# Patient Record
Sex: Female | Born: 1965 | Race: White | Hispanic: Yes | Marital: Single | Smoking: Never smoker
Health system: Southern US, Community
[De-identification: ages and names within clinical notes are randomized; demographics above are authoritative.]

## PROBLEM LIST (undated history)

## (undated) DIAGNOSIS — H547 Unspecified visual loss: Secondary | ICD-10-CM

## (undated) DIAGNOSIS — F329 Major depressive disorder, single episode, unspecified: Secondary | ICD-10-CM

## (undated) DIAGNOSIS — K219 Gastro-esophageal reflux disease without esophagitis: Secondary | ICD-10-CM

## (undated) DIAGNOSIS — S6990XA Unspecified injury of unspecified wrist, hand and finger(s), initial encounter: Secondary | ICD-10-CM

## (undated) DIAGNOSIS — K432 Incisional hernia without obstruction or gangrene: Secondary | ICD-10-CM

## (undated) DIAGNOSIS — Z9884 Bariatric surgery status: Secondary | ICD-10-CM

## (undated) DIAGNOSIS — F32A Depression, unspecified: Secondary | ICD-10-CM

## (undated) DIAGNOSIS — F419 Anxiety disorder, unspecified: Secondary | ICD-10-CM

## (undated) HISTORY — PX: HERNIA REPAIR: SHX51

## (undated) HISTORY — PX: GASTRIC BYPASS: SHX52

## (undated) HISTORY — PX: CHOLECYSTECTOMY: SHX55

---

## 2016-05-11 ENCOUNTER — Emergency Department (HOSPITAL_COMMUNITY): Payer: Medicare Other

## 2016-05-11 ENCOUNTER — Encounter (HOSPITAL_COMMUNITY): Payer: Self-pay | Admitting: Emergency Medicine

## 2016-05-11 ENCOUNTER — Observation Stay (HOSPITAL_COMMUNITY)
Admission: EM | Admit: 2016-05-11 | Discharge: 2016-05-12 | Disposition: A | Discharge status: Home / Self Care | Payer: Medicare Other | Attending: Student in an Organized Health Care Education/Training Program | Admitting: Student in an Organized Health Care Education/Training Program

## 2016-05-11 DIAGNOSIS — F329 Major depressive disorder, single episode, unspecified: Secondary | ICD-10-CM | POA: Diagnosis not present

## 2016-05-11 DIAGNOSIS — K219 Gastro-esophageal reflux disease without esophagitis: Secondary | ICD-10-CM | POA: Insufficient documentation

## 2016-05-11 DIAGNOSIS — Z9049 Acquired absence of other specified parts of digestive tract: Secondary | ICD-10-CM | POA: Diagnosis not present

## 2016-05-11 DIAGNOSIS — G8929 Other chronic pain: Secondary | ICD-10-CM | POA: Diagnosis not present

## 2016-05-11 DIAGNOSIS — R42 Dizziness and giddiness: Secondary | ICD-10-CM | POA: Insufficient documentation

## 2016-05-11 DIAGNOSIS — R0789 Other chest pain: Secondary | ICD-10-CM | POA: Diagnosis not present

## 2016-05-11 DIAGNOSIS — Z9884 Bariatric surgery status: Secondary | ICD-10-CM | POA: Insufficient documentation

## 2016-05-11 DIAGNOSIS — D649 Anemia, unspecified: Secondary | ICD-10-CM | POA: Diagnosis not present

## 2016-05-11 DIAGNOSIS — R079 Chest pain, unspecified: Secondary | ICD-10-CM | POA: Diagnosis present

## 2016-05-11 DIAGNOSIS — N390 Urinary tract infection, site not specified: Secondary | ICD-10-CM | POA: Insufficient documentation

## 2016-05-11 DIAGNOSIS — F419 Anxiety disorder, unspecified: Secondary | ICD-10-CM | POA: Insufficient documentation

## 2016-05-11 HISTORY — DX: Incisional hernia without obstruction or gangrene: K43.2

## 2016-05-11 HISTORY — DX: Major depressive disorder, single episode, unspecified: F32.9

## 2016-05-11 HISTORY — DX: Gastro-esophageal reflux disease without esophagitis: K21.9

## 2016-05-11 HISTORY — DX: Depression, unspecified: F32.A

## 2016-05-11 HISTORY — DX: Anxiety disorder, unspecified: F41.9

## 2016-05-11 HISTORY — DX: Unspecified visual loss: H54.7

## 2016-05-11 HISTORY — DX: Bariatric surgery status: Z98.84

## 2016-05-11 LAB — BASIC METABOLIC PANEL
Anion gap: 6 (ref 5–15)
BUN: 14 mg/dL (ref 6–20)
CALCIUM: 8.8 mg/dL — AB (ref 8.9–10.3)
CHLORIDE: 113 mmol/L — AB (ref 101–111)
CO2: 21 mmol/L — ABNORMAL LOW (ref 22–32)
CREATININE: 0.88 mg/dL (ref 0.44–1.00)
Glucose, Bld: 68 mg/dL (ref 65–99)
Potassium: 3.9 mmol/L (ref 3.5–5.1)
SODIUM: 140 mmol/L (ref 135–145)

## 2016-05-11 LAB — HEPATIC FUNCTION PANEL
ALBUMIN: 3 g/dL — AB (ref 3.5–5.0)
ALK PHOS: 138 U/L — AB (ref 38–126)
ALT: 34 U/L (ref 14–54)
AST: 112 U/L — ABNORMAL HIGH (ref 15–41)
BILIRUBIN INDIRECT: 0.2 mg/dL — AB (ref 0.3–0.9)
Bilirubin, Direct: 0.2 mg/dL (ref 0.1–0.5)
TOTAL PROTEIN: 6.2 g/dL — AB (ref 6.5–8.1)
Total Bilirubin: 0.4 mg/dL (ref 0.3–1.2)

## 2016-05-11 LAB — FERRITIN: FERRITIN: 36 ng/mL (ref 11–307)

## 2016-05-11 LAB — URINALYSIS, ROUTINE W REFLEX MICROSCOPIC
BILIRUBIN URINE: NEGATIVE
Glucose, UA: NEGATIVE mg/dL
HGB URINE DIPSTICK: NEGATIVE
KETONES UR: NEGATIVE mg/dL
NITRITE: POSITIVE — AB
Protein, ur: NEGATIVE mg/dL
SPECIFIC GRAVITY, URINE: 1.013 (ref 1.005–1.030)
pH: 6 (ref 5.0–8.0)

## 2016-05-11 LAB — URINE MICROSCOPIC-ADD ON

## 2016-05-11 LAB — TROPONIN I

## 2016-05-11 LAB — CBC WITH DIFFERENTIAL/PLATELET
BASOS ABS: 0 10*3/uL (ref 0.0–0.1)
BASOS PCT: 0 %
EOS ABS: 0.1 10*3/uL (ref 0.0–0.7)
EOS PCT: 1 %
HCT: 32.4 % — ABNORMAL LOW (ref 36.0–46.0)
HEMOGLOBIN: 9.8 g/dL — AB (ref 12.0–15.0)
LYMPHS ABS: 1.1 10*3/uL (ref 0.7–4.0)
Lymphocytes Relative: 14 %
MCH: 27.2 pg (ref 26.0–34.0)
MCHC: 30.2 g/dL (ref 30.0–36.0)
MCV: 90 fL (ref 78.0–100.0)
Monocytes Absolute: 0.4 10*3/uL (ref 0.1–1.0)
Monocytes Relative: 5 %
NEUTROS PCT: 80 %
Neutro Abs: 6.6 10*3/uL (ref 1.7–7.7)
PLATELETS: 207 10*3/uL (ref 150–400)
RBC: 3.6 MIL/uL — AB (ref 3.87–5.11)
RDW: 16.2 % — ABNORMAL HIGH (ref 11.5–15.5)
WBC: 8.2 10*3/uL (ref 4.0–10.5)

## 2016-05-11 LAB — CBC
HCT: 34.7 % — ABNORMAL LOW (ref 36.0–46.0)
Hemoglobin: 10.4 g/dL — ABNORMAL LOW (ref 12.0–15.0)
MCH: 26.8 pg (ref 26.0–34.0)
MCHC: 30 g/dL (ref 30.0–36.0)
MCV: 89.4 fL (ref 78.0–100.0)
PLATELETS: 225 10*3/uL (ref 150–400)
RBC: 3.88 MIL/uL (ref 3.87–5.11)
RDW: 16.3 % — ABNORMAL HIGH (ref 11.5–15.5)
WBC: 8.8 10*3/uL (ref 4.0–10.5)

## 2016-05-11 LAB — POC OCCULT BLOOD, ED: FECAL OCCULT BLD: NEGATIVE

## 2016-05-11 LAB — I-STAT TROPONIN, ED: TROPONIN I, POC: 0 ng/mL (ref 0.00–0.08)

## 2016-05-11 LAB — TSH: TSH: 0.936 u[IU]/mL (ref 0.350–4.500)

## 2016-05-11 LAB — LIPASE, BLOOD: LIPASE: 12 U/L (ref 11–51)

## 2016-05-11 LAB — D-DIMER, QUANTITATIVE (NOT AT ARMC): D DIMER QUANT: 0.38 ug{FEU}/mL (ref 0.00–0.50)

## 2016-05-11 LAB — GAMMA GT: GGT: 131 U/L — AB (ref 7–50)

## 2016-05-11 MED ORDER — MORPHINE SULFATE (PF) 2 MG/ML IV SOLN
2.0000 mg | Freq: Once | INTRAVENOUS | Status: AC
  Administered 2016-05-11: 2 mg via INTRAVENOUS
  Filled 2016-05-11: qty 1

## 2016-05-11 MED ORDER — ALBUTEROL SULFATE (2.5 MG/3ML) 0.083% IN NEBU: 3.0000 mL | INHALATION_SOLUTION | Freq: Two times a day (BID) | RESPIRATORY_TRACT | Status: DC

## 2016-05-11 MED ORDER — GI COCKTAIL ~~LOC~~
30.0000 mL | Freq: Once | ORAL | Status: AC
  Administered 2016-05-11: 30 mL via ORAL
  Filled 2016-05-11: qty 30

## 2016-05-11 MED ORDER — MECLIZINE HCL 25 MG PO TABS
25.0000 mg | ORAL_TABLET | Freq: Once | ORAL | Status: AC
  Administered 2016-05-11: 25 mg via ORAL
  Filled 2016-05-11: qty 1

## 2016-05-11 MED ORDER — GI COCKTAIL ~~LOC~~
30.0000 mL | Freq: Two times a day (BID) | ORAL | Status: DC | PRN
  Filled 2016-05-11: qty 30

## 2016-05-11 MED ORDER — ALPRAZOLAM 0.5 MG PO TABS
1.0000 mg | ORAL_TABLET | Freq: Three times a day (TID) | ORAL | Status: DC
  Administered 2016-05-11 – 2016-05-12 (×4): 1 mg via ORAL
  Filled 2016-05-11 (×4): qty 2

## 2016-05-11 MED ORDER — GABAPENTIN 300 MG PO CAPS
300.0000 mg | ORAL_CAPSULE | Freq: Three times a day (TID) | ORAL | Status: DC
  Administered 2016-05-11 – 2016-05-12 (×3): 300 mg via ORAL
  Filled 2016-05-11 (×3): qty 1

## 2016-05-11 MED ORDER — FAMOTIDINE IN NACL 20-0.9 MG/50ML-% IV SOLN
20.0000 mg | Freq: Once | INTRAVENOUS | Status: AC
  Administered 2016-05-11: 20 mg via INTRAVENOUS
  Filled 2016-05-11: qty 50

## 2016-05-11 MED ORDER — ONDANSETRON HCL 4 MG/2ML IJ SOLN
4.0000 mg | Freq: Once | INTRAMUSCULAR | Status: AC
  Administered 2016-05-11: 4 mg via INTRAVENOUS
  Filled 2016-05-11: qty 2

## 2016-05-11 MED ORDER — DEXTROSE 5 % IV SOLN
1.0000 g | INTRAVENOUS | Status: DC
  Administered 2016-05-11: 1 g via INTRAVENOUS
  Filled 2016-05-11 (×2): qty 10

## 2016-05-11 MED ORDER — FAMOTIDINE 20 MG PO TABS
40.0000 mg | ORAL_TABLET | Freq: Every day | ORAL | Status: DC
  Administered 2016-05-11: 40 mg via ORAL
  Filled 2016-05-11 (×2): qty 2

## 2016-05-11 MED ORDER — ALBUTEROL SULFATE (2.5 MG/3ML) 0.083% IN NEBU: 2.5000 mg | INHALATION_SOLUTION | RESPIRATORY_TRACT | Status: DC | PRN

## 2016-05-11 MED ORDER — METOCLOPRAMIDE HCL 5 MG/5ML PO SOLN
5.0000 mg | Freq: Three times a day (TID) | ORAL | Status: DC | PRN
  Administered 2016-05-12: 5 mg via ORAL
  Filled 2016-05-11 (×2): qty 5

## 2016-05-11 MED ORDER — ESCITALOPRAM OXALATE 10 MG PO TABS
10.0000 mg | ORAL_TABLET | Freq: Every day | ORAL | Status: DC
  Administered 2016-05-12: 10 mg via ORAL
  Filled 2016-05-11: qty 1

## 2016-05-11 MED ORDER — ZOLPIDEM TARTRATE 5 MG PO TABS
5.0000 mg | ORAL_TABLET | Freq: Once | ORAL | Status: AC
  Administered 2016-05-11: 5 mg via ORAL
  Filled 2016-05-11: qty 1

## 2016-05-11 MED ORDER — FENTANYL CITRATE (PF) 100 MCG/2ML IJ SOLN
50.0000 ug | Freq: Once | INTRAMUSCULAR | Status: AC
  Administered 2016-05-11: 50 ug via INTRAVENOUS
  Filled 2016-05-11: qty 2

## 2016-05-11 MED ORDER — PANTOPRAZOLE SODIUM 40 MG PO TBEC
40.0000 mg | DELAYED_RELEASE_TABLET | Freq: Every day | ORAL | Status: DC
  Administered 2016-05-12: 40 mg via ORAL
  Filled 2016-05-11: qty 1

## 2016-05-11 MED ORDER — SODIUM CHLORIDE 0.9 % IV BOLUS (SEPSIS)
1000.0000 mL | Freq: Once | INTRAVENOUS | Status: AC
  Administered 2016-05-11: 1000 mL via INTRAVENOUS

## 2016-05-11 MED ORDER — MORPHINE SULFATE (PF) 2 MG/ML IV SOLN
2.0000 mg | Freq: Once | INTRAVENOUS | Status: AC
Start: 2016-05-11 — End: 2016-05-11
  Administered 2016-05-11: 2 mg via INTRAVENOUS
  Filled 2016-05-11: qty 1

## 2016-05-11 NOTE — ED Provider Notes (Signed)
[facility]-EMERGENCY DEPT Provider Note   CSN: [id number] Arrival date & time: 05/11/16  1335  First Provider Contact:  First MD Initiated Contact with Patient 05/11/16 1400     History   Chief Complaint Chief Complaint  Patient presents with  . Chest Pain   HPI  Tonya Young is an 50 y.o. female with history of aspiration pneumonia, s/p gastric bypass 1999 now with multiple hernias, depression, anxiety, chronic pain, albinism, vertigo who presents to the ED for evaluation of chest pain. She reports she was in her usual state of health until this afternoon after lunch at work she walked up the stairs, sat down at her desk, and within a couple minutes felt nauseated and had a heavy left sided chest pain. She reports the pain was severe. She reports associated shortness of breath but she states she has some shortness of breath at baseline so she is not sure if this SOB was different. She states she has also felt dizzy all day but has some issues with veritgo at baseline and takes meclizine; she states this dizziness feels different. She states she feels lightheaded but not like she is going to pass out. She is also reporting abdominal pain from her hernia but states this is baseline. In the ED now pt feels improvement in her chest pain. She received  ASA and SL nitro with some improvement in her pain. She states her abdomen hurts at baseline. Denies SOB currently. Still feels nauseated and somewhat dizzy. Denies dysuria, urinary frequency/urgency. Denies emesis or diarrhea. Denies fever or chills. She typically gets all of her medical care in [facility].  Past Medical History:  Diagnosis Date  . Blindness     There are no active problems to display for this patient.   Past Surgical History:  Procedure Laterality Date  . CESAREAN SECTION    . CHOLECYSTECTOMY    . GASTRIC BYPASS    . HERNIA REPAIR      OB History    No data available       Home Medications    Prior to Admission  medications   Medication Sig Start Date End Date Taking? Authorizing Provider  ALPRAZolam Prudy Feeler) 1 MG tablet Take 1 tablet by mouth 3 (three) times daily. 04/22/16  Yes Historical Provider, MD  carisoprodol (SOMA) 350 MG tablet Take 1 tablet by mouth 2 (two) times daily. 05/08/16  Yes Historical Provider, MD  escitalopram (LEXAPRO) 10 MG tablet Take 1 tablet by mouth daily. 02/04/16  Yes Historical Provider, MD  Eszopiclone 3 MG TABS Take 1 tablet by mouth at bedtime. 04/22/16  Yes Historical Provider, MD  famotidine (PEPCID) 40 MG tablet Take 1 tablet by mouth daily. 04/19/16  Yes Historical Provider, MD  gabapentin (NEURONTIN) 300 MG capsule Take 1 capsule by mouth 3 (three) times daily. 04/22/16  Yes Historical Provider, MD  metoCLOPramide (REGLAN) 5 MG/5ML solution Take 5 mLs by mouth 3 (three) times daily as needed for nausea. 04/19/16  Yes Historical Provider, MD  omeprazole (PRILOSEC) 40 MG capsule Take 1 capsule by mouth daily. 02/20/16  Yes Historical Provider, MD  VENTOLIN HFA 108 (90 Base) MCG/ACT inhaler Inhale 2 puffs into the lungs 2 (two) times daily. 05/04/16  Yes Historical Provider, MD    Family History No family history on file.  Social History Social History  Substance Use Topics  . Smoking status: Never Smoker  . Smokeless tobacco: Not on file  . Alcohol use Yes     Comment: occasional  Allergies   Latex   Review of Systems Review of Systems 10 Systems reviewed and are negative for acute change except as noted in the HPI.   Physical Exam Updated Vital Signs BP 101/77   Pulse 78   Temp 97.9 F (36.6 C) (Oral)   Resp 25   LMP  (Approximate)   SpO2 97%   Physical Exam  Constitutional: She is oriented to person, place, and time. No distress.  HENT:  Right Ear: External ear normal.  Left Ear: External ear normal.  Nose: Nose normal.  Mouth/Throat: Oropharynx is clear and moist. No oropharyngeal exudate.  Eyes: Conjunctivae are normal.  Neck: Normal range  of motion. Neck supple.  Cardiovascular: Normal rate, regular rhythm, normal heart sounds and intact distal pulses.   Pulmonary/Chest: Effort normal and breath sounds normal. No respiratory distress. She has no wheezes.  Abdominal: Soft. Bowel sounds are normal. She exhibits no distension. There is no rebound and no guarding.  Mild diffuse abdominal tenderness without guarding. Abdomen soft nondistended.  Musculoskeletal: She exhibits no edema.  No LE edema or calf tenderness  Lymphadenopathy:    She has no cervical adenopathy.  Neurological: She is alert and oriented to person, place, and time. No cranial nerve deficit.  Moves all extremities freely  Skin: Skin is warm and dry. She is not diaphoretic.  albinism  Psychiatric: She has a normal mood and affect.  Nursing note and vitals reviewed.    ED Treatments / Results  Labs (all labs ordered are listed, but only abnormal results are displayed) Labs Reviewed  BASIC METABOLIC PANEL - Abnormal; Notable for the following:       Result Value   Chloride 113 (*)    CO2 21 (*)    Calcium 8.8 (*)    All other components within normal limits  CBC WITH DIFFERENTIAL/PLATELET - Abnormal; Notable for the following:    RBC 3.60 (*)    Hemoglobin 9.8 (*)    HCT 32.4 (*)    RDW 16.2 (*)    All other components within normal limits  D-DIMER, QUANTITATIVE (NOT AT [facility])  URINALYSIS, ROUTINE W REFLEX MICROSCOPIC (NOT AT [facility])  HEPATIC FUNCTION PANEL  LIPASE, BLOOD  I-STAT TROPOININ, ED  POC OCCULT BLOOD, ED    EKG  EKG Interpretation  Date/Time:  Friday May 11 2016 13:48:28 EDT Ventricular Rate:  78 PR Interval:    QRS Duration: 92 QT Interval:  378 QTC Calculation: 431 R Axis:   30 Text Interpretation:  Sinus rhythm no acute ST/T changes No old tracing to compare Confirmed by GOLDSTON MD, SCOTT [phone]) on 05/11/2016 3:06:12 PM       Radiology Dg Chest 2 View  Result Date: 05/11/2016 CLINICAL DATA:  Chest pain and dizziness.   Nausea EXAM: CHEST  2 VIEW COMPARISON:  None FINDINGS: Mild cardiac enlargement. No pleural effusion or edema. Diffuse increased interstitial prominence is identified. Asymmetric elevation of the right hemidiaphragm is identified. No focal airspace opacity identified. IMPRESSION: 1. Diffuse interstitial prominence is identified throughout both lungs which may reflect chronic lung disease or superimposed atypical infection. Pulmonary edema may also have a similar appearance. 2. Asymmetric elevation of the right hemidiaphragm. Electronically Signed   By: Signa Kell M.D.   On: 05/11/2016 15:11    Procedures Procedures (including critical care time)  Medications Ordered in ED Medications  sodium chloride 0.9 % bolus 1,000 mL (not administered)  gi cocktail (Maalox,Lidocaine,Donnatal) (30 mLs Oral Given 05/11/16 1439)  ondansetron (ZOFRAN)  injection 4 mg (4 mg Intravenous Given 05/11/16 1439)  fentaNYL (SUBLIMAZE) injection 50 mcg (50 mcg Intravenous Given 05/11/16 1439)  meclizine (ANTIVERT) tablet 25 mg (25 mg Oral Given 05/11/16 1533)  famotidine (PEPCID) IVPB 20 mg premix (20 mg Intravenous New Bag/Given 05/11/16 1533)  morphine 2 MG/ML injection 2 mg (2 mg Intravenous Given 05/11/16 1542)     Initial Impression / Assessment and Plan / ED Course  I have reviewed the triage vital signs and the nursing notes.  Pertinent labs & imaging results that were available during my care of the patient were reviewed by me and considered in my medical decision making (see chart for details).  Clinical Course   EKG nonacute. Troponin negative. D-dimer negative. CXR with possible chronic lung disease vs atypical infection vs pulmonary edema. Pt denies recent URI symptoms. Her CBC does reveal a hemoglobin of 9.8. She reports some chronic anemia but never this low. CBC at [facility] reveals last hgb of 12.5 in early June, and that appears to be her baseline. She denies recent BRBPR, black tarry stools, heavy vaginal  bleeding, easy bruising. Will add heme occult. Her pressures are in the low 100s systolic which she states is baseline for her.   Heme occult negative. Chest pain resolved. Pt continues to feel lightheaded. Nausea is better. She does not have significant cardiac risk factors and I doubt her chest pain is ACS or PE. With her associated lightheadedness/dizziness and nausea I do suspect pt is experiencing some symptomatic anemia with her hemoglobin of 9.8 with baseline 12.5. Suspect pt would benefit from obs admission for serial CBC and further evaluation. Monitor chest pain and serial troponins. I will consult medicine.   I spoke with the IMTS who will admit pt to obs. Appreciate assistance.  Final Clinical Impressions(s) / ED Diagnoses   Final diagnoses:  Symptomatic anemia  Chest pain, unspecified chest pain type  Lightheadedness    New Prescriptions New Prescriptions   No medications on file     Carlene Coria, Cordelia Poche 05/11/16 1636    Pricilla Loveless, MD 05/15/16 [phone]

## 2016-05-11 NOTE — ED Triage Notes (Signed)
Per [facility] patient complaining of chest pain, dizziness, nausea.  Received 324 aspirin, 0.4 SL nitro en route.  Describes pain as squeezing, rates severity as 4 after SL nitro.  Patient states she ate lunch, then walked up three flights of stairs and sat down to start work.  While seated the pain started.  Patient is blind.  20g IV in right wrist saline locked.  Patient alert and oriented and in no apparent distress at this time.

## 2016-05-11 NOTE — Progress Notes (Addendum)
Patient arrived the unit from the ED, vital signs obtained,placed on tele, ccmd notified, patient oriented to room and staff, patient resting quietly in bed, call light within reach, bed in lowest position, will continue to monitor

## 2016-05-11 NOTE — H&P (Signed)
Date: 05/11/2016               Patient Name:  Wayne Brunker MRN: [id number]  DOB: 04/26/1966 Age / Sex: 50 y.o., female   PCP: Pcp Not In System         Medical Service: Internal Medicine Teaching Service         Attending Physician: Dr. Axel Filler, MD    First Contact: Dr. Holley Raring Pager: [phone]  Second Contact: Dr. Charlott Rakes Pager: [phone]       After Hours (After 5p/  First Contact Pager: [phone]  weekends / holidays): Second Contact Pager: [phone]   Chief Complaint: CP and dizziness  History of Present Illness: Ms. Devina Bezold is a 50 y.o. female with a h/o of gastric bypass (3704) complicated by multiple hernias s/p repair, GERD, depression, anxiety, albinism, vertigo who presents with complaints of CP, dizziness, and abdominal pain.  Her symptoms began abruptly after lunch today when she sat down at work and began to feel dizzy. Shortly after she noticed abdominal pain in the epigastric region and atypical CP which lasted for 1 hour and was not relieved by nitro. She reports that she did not experience SOB. She reports that her abdominal pain is consistent with h/o pain from her ventral hernias. She felt better after receiving GI cocktail in the ED.  She denies lower abd pain, denies changes to BM. Endorses some hesitancy with urination, darker coloration, and foul smell. She reports h/o UTI in the past, and describes these symptoms as similar.  She has a h/o anemia tx w/ Fe in the past. Recently underwent closed fracture reduction of left humerus. Also recent CAP on June 9th.  Meds: Current Facility-Administered Medications  Medication Dose Route Frequency Provider Last Rate Last Dose  . albuterol (PROVENTIL) (2.5 MG/3ML) 0.083% nebulizer solution 2.5 mg  2.5 mg Inhalation Q4H PRN Norman Herrlich, MD      . ALPRAZolam Duanne Moron) tablet 1 mg  1 mg Oral TID Norman Herrlich, MD      . cefTRIAXone (ROCEPHIN) 1 g in dextrose 5 % 50 mL IVPB  1 g Intravenous  Q24H Valeda Malm Rumbarger, RPH 100 mL/hr at 05/11/16 1839 1 g at 05/11/16 1839  . [START ON 05/12/2016] escitalopram (LEXAPRO) tablet 10 mg  10 mg Oral Daily Norman Herrlich, MD      . famotidine (PEPCID) tablet 40 mg  40 mg Oral Daily Norman Herrlich, MD      . gabapentin (NEURONTIN) capsule 300 mg  300 mg Oral TID Norman Herrlich, MD      . gi cocktail (Maalox,Lidocaine,Donnatal)  30 mL Oral BID PRN Norman Herrlich, MD      . metoCLOPramide (REGLAN) 5 MG/5ML solution 5 mg  5 mg Oral TID PRN Norman Herrlich, MD      . pantoprazole (PROTONIX) EC tablet 40 mg  40 mg Oral QHS Norman Herrlich, MD       Allergies: Allergies as of 05/11/2016 - Review Complete 05/11/2016  Allergen Reaction Noted  . Latex  05/11/2016   Past Medical History:  Diagnosis Date  . Anxiety and depression   . Blindness   . GERD (gastroesophageal reflux disease)   . Hernia, incisional   . S/P gastric bypass    Family History: No family h/o CAD.  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  Review of Systems: A complete  ROS was negative except as per HPI. Review of Systems  Constitutional: Negative for chills, diaphoresis, fever, malaise/fatigue and weight loss.  Respiratory: Negative for cough, shortness of breath and wheezing.   Cardiovascular: Positive for chest pain. Negative for palpitations and leg swelling.  Gastrointestinal: Positive for abdominal pain and heartburn. Negative for blood in stool, constipation, diarrhea, melena, nausea and vomiting.  Genitourinary: Positive for dysuria.  Neurological: Positive for dizziness. Negative for tremors, weakness and headaches.    Physical Exam: Vitals:   05/11/16 1730 05/11/16 1810 05/11/16 1815 05/11/16 1907  BP: 115/64 106/67 107/63 (!) 94/55  Pulse: 90 88 94 87  Resp: 17 16 18 18   Temp:    98.2 F (36.8 C)  TempSrc:    Oral  SpO2: 95% 93% 90% 94%   Physical Exam  Constitutional: She is oriented to person,  place, and time. She appears well-developed. She is cooperative. No distress.  HENT:  Head: Normocephalic and atraumatic.  Right Ear: Hearing normal.  Left Ear: Hearing normal.  Nose: Nose normal.  Mouth/Throat: Mucous membranes are dry.  Eyes: Conjunctivae are normal. No scleral icterus.  Cardiovascular: Normal rate, regular rhythm, S1 normal, S2 normal and intact distal pulses.  Exam reveals no gallop.   No murmur heard. Pulmonary/Chest: Effort normal. No respiratory distress. She has no wheezes. She has rhonchi (fine) in the right lower field. She has no rales. She exhibits no tenderness. Breasts are symmetrical.  Abdominal: Soft. Normal appearance and bowel sounds are normal. She exhibits no ascites. There is no hepatosplenomegaly. There is no tenderness. There is no CVA tenderness.  Musculoskeletal: Normal range of motion.  Neurological: She is alert and oriented to person, place, and time. She has normal strength.  Skin: Skin is warm, dry and intact. She is not diaphoretic.  albinoid  Psychiatric: She has a normal mood and affect. Her speech is normal and behavior is normal.   Labs: CBC:  Recent Labs Lab 05/11/16 1436  WBC 8.2  NEUTROABS 6.6  HGB 9.8*  HCT 32.4*  MCV 90.0  PLT 209   Basic Metabolic Panel:  Recent Labs Lab 05/11/16 1436  NA 140  K 3.9  CL 113*  CO2 21*  GLUCOSE 68  BUN 14  CREATININE 0.88  CALCIUM 8.8*   Cardiac Enzymes:  Recent Labs Lab 05/11/16 1442  TROPIPOC 0.00   Urine Studies: Urinalysis    Component Value Date/Time   COLORURINE YELLOW 05/11/2016 1534   APPEARANCEUR CLEAR 05/11/2016 1534   LABSPEC 1.013 05/11/2016 1534   PHURINE 6.0 05/11/2016 1534   GLUCOSEU NEGATIVE 05/11/2016 1534   HGBUR NEGATIVE 05/11/2016 1534   BILIRUBINUR NEGATIVE 05/11/2016 1534   KETONESUR NEGATIVE 05/11/2016 1534   PROTEINUR NEGATIVE 05/11/2016 1534   NITRITE POSITIVE (A) 05/11/2016 1534   LEUKOCYTESUR SMALL (A) 05/11/2016 1534   EKG: EKG:  normal EKG, normal sinus rhythm.  Imaging: Dg Chest 2 View  Result Date: 05/11/2016 CLINICAL DATA:  Chest pain and dizziness.  Nausea EXAM: CHEST  2 VIEW COMPARISON:  None FINDINGS: Mild cardiac enlargement. No pleural effusion or edema. Diffuse increased interstitial prominence is identified. Asymmetric elevation of the right hemidiaphragm is identified. No focal airspace opacity identified. IMPRESSION: 1. Diffuse interstitial prominence is identified throughout both lungs which may reflect chronic lung disease or superimposed atypical infection. Pulmonary edema may also have a similar appearance. 2. Asymmetric elevation of the right hemidiaphragm. Electronically Signed   By: Kerby Moors M.D.   On: 05/11/2016 15:11    Assessment &  Plan by Problem: Principal Problem:   Symptomatic anemia Active Problems:   Chest pain  Ms. Makesha Bracher is a 50 y.o. female with acute onset of SOB, dizziness, CP, and epigastric abdominal pain.  1) Symptomatic Anemia: Pt was found to have Hg of 9.8, down from 12.5 on 03/14/16 (Care Everywhere). Pt reports h/o anemia w/ Fe supplementation in the past. She denies any red blood in the stool, dark tarry stools,or hematemesis. FOBT negative in the ED. Pt is apigmented d/t albinism, but conjunctival pallor was not noted. - trend CBC @ 1730 and in AM - check Ferritin, TSH  2) Atypical chest pain: Sudden onset associated with lightheadedness, no SOB. Not associated w/ activity, non-radiating, not relieved by nitro, normal EKG. Pain is non-pleuritic, non-hypoxic, not TTP. CXR w/ non-specific finding of ILD vs atypical infection vs edema. Could be multifactorial - possibly contributed by anxiety, possibly symptomatic anemia. Likely GERD component in the setting of s/p gastric bypass in 1999 w/ chronic reflux tx w/ PPI, H2-blocker, Reglan at home, and hiatal hernia. Pt reports symptoms of CP resolved w/ GI cocktail in the ED, but epigastric abdominal pain persists. -  trending troponins x3, repeat EKG in AM - CMP in AM - GI cocktail - continue protonix, famotidine, metoclopromide  3) Abdominal pain: S/p gastric bypass, s/p cholecystectomy. Possibly 2/2 gastritis vs GERD vs UTI. Elevated Alk Phos, AST, ALT. - GGT pending, consider Abd [place] - GI meds as above - UCx - Rocephin IV for UTI  DVT PPx - SCD's while in bed  Code Status - Full  Consults Placed - none  Dispo: Admit patient to Inpatient with expected length of stay greater than 2 midnights.  Signed: Holley Raring, MD 05/11/2016, 7:10 PM  Pager: [phone]

## 2016-05-12 DIAGNOSIS — K219 Gastro-esophageal reflux disease without esophagitis: Secondary | ICD-10-CM | POA: Diagnosis not present

## 2016-05-12 DIAGNOSIS — Z9884 Bariatric surgery status: Secondary | ICD-10-CM | POA: Diagnosis not present

## 2016-05-12 DIAGNOSIS — N390 Urinary tract infection, site not specified: Secondary | ICD-10-CM

## 2016-05-12 DIAGNOSIS — B9689 Other specified bacterial agents as the cause of diseases classified elsewhere: Secondary | ICD-10-CM

## 2016-05-12 LAB — CBC
HCT: 36 % (ref 36.0–46.0)
Hemoglobin: 10.7 g/dL — ABNORMAL LOW (ref 12.0–15.0)
MCH: 27.2 pg (ref 26.0–34.0)
MCHC: 29.7 g/dL — AB (ref 30.0–36.0)
MCV: 91.4 fL (ref 78.0–100.0)
PLATELETS: 263 10*3/uL (ref 150–400)
RBC: 3.94 MIL/uL (ref 3.87–5.11)
RDW: 16.7 % — ABNORMAL HIGH (ref 11.5–15.5)
WBC: 6.9 10*3/uL (ref 4.0–10.5)

## 2016-05-12 LAB — TROPONIN I

## 2016-05-12 LAB — COMPREHENSIVE METABOLIC PANEL
ALT: 45 U/L (ref 14–54)
ANION GAP: 5 (ref 5–15)
AST: 68 U/L — ABNORMAL HIGH (ref 15–41)
Albumin: 2.8 g/dL — ABNORMAL LOW (ref 3.5–5.0)
Alkaline Phosphatase: 144 U/L — ABNORMAL HIGH (ref 38–126)
BUN: 8 mg/dL (ref 6–20)
CHLORIDE: 112 mmol/L — AB (ref 101–111)
CO2: 24 mmol/L (ref 22–32)
CREATININE: 0.7 mg/dL (ref 0.44–1.00)
Calcium: 8.5 mg/dL — ABNORMAL LOW (ref 8.9–10.3)
Glucose, Bld: 80 mg/dL (ref 65–99)
POTASSIUM: 4.1 mmol/L (ref 3.5–5.1)
SODIUM: 141 mmol/L (ref 135–145)
Total Bilirubin: 0.3 mg/dL (ref 0.3–1.2)
Total Protein: 6 g/dL — ABNORMAL LOW (ref 6.5–8.1)

## 2016-05-12 MED ORDER — FLUCONAZOLE 150 MG PO TABS: 150.0000 mg | ORAL_TABLET | Freq: Once | ORAL | 0 refills | Status: DC

## 2016-05-12 MED ORDER — FLUCONAZOLE 150 MG PO TABS: 150.0000 mg | ORAL_TABLET | Freq: Once | ORAL | 0 refills | Status: AC

## 2016-05-12 MED ORDER — GI COCKTAIL ~~LOC~~
30.0000 mL | Freq: Once | ORAL | Status: AC
  Administered 2016-05-12: 30 mL via ORAL
  Filled 2016-05-12: qty 30

## 2016-05-12 MED ORDER — NITROFURANTOIN MONOHYD MACRO 100 MG PO CAPS: 100.0000 mg | ORAL_CAPSULE | Freq: Two times a day (BID) | ORAL | 0 refills | Status: DC

## 2016-05-12 MED ORDER — NITROFURANTOIN MONOHYD MACRO 100 MG PO CAPS: 100.0000 mg | ORAL_CAPSULE | Freq: Two times a day (BID) | ORAL | 0 refills | Status: AC

## 2016-05-12 MED ORDER — ACETAMINOPHEN 500 MG PO TABS
1000.0000 mg | ORAL_TABLET | Freq: Four times a day (QID) | ORAL | Status: DC | PRN
  Administered 2016-05-12: 1000 mg via ORAL
  Filled 2016-05-12: qty 2

## 2016-05-12 NOTE — Progress Notes (Signed)
Order received to discharge patient.  Patient expresses readiness to discharge.  Telemetry monitor was removed and CCMD notified.  Discharge instructions, follow up, medication and instructions for their use were discussed with patient and she voiced understanding.

## 2016-05-12 NOTE — Progress Notes (Signed)
Notwithstanding hospital policy patient says she does not want to discharge in a wheelchair and will instead walk out under her own power with her friend escorting her.

## 2016-05-12 NOTE — Progress Notes (Signed)
Subjective: Currently, the patient is feeling well w/o CP or SOB or diziness. She does complain of some abd pain in the epigastric region but was tolerating small amounts of food and drink well. Palliated by GI cocktail.  Objective: Vital signs in last 24 hours: Vitals:   05/11/16 1815 05/11/16 1907 05/12/16 0325 05/12/16 1427  BP: 107/63 (!) 94/55 99/62 (!) 98/55  Pulse: 94 87 71 (!) 57  Resp: Temp:  98.2 F (36.8 C) 98.2 F (36.8 C) 98.3 F (36.8 C)  TempSrc:  Oral Oral Oral  SpO2: 90% 94% 93% 92%   Physical Exam: Physical Exam  Constitutional: She appears well-developed. She is cooperative. No distress.  Eyes:  Blind 2/2 albinism  Cardiovascular: Normal rate, regular rhythm, normal heart sounds and normal pulses.  Exam reveals no gallop.   No murmur heard. Pulmonary/Chest: Effort normal and breath sounds normal. No respiratory distress. Breasts are symmetrical.  Abdominal: Soft. Bowel sounds are normal. There is no tenderness.  Musculoskeletal: She exhibits no edema.  Skin:  Albino   Labs: CBC:  Recent Labs Lab 05/11/16 1436 05/11/16 2050 05/12/16 0535  WBC 8.2 8.8 6.9  NEUTROABS 6.6  --   --   HGB 9.8* 10.4* 10.7*  HCT 32.4* 34.7* 36.0  MCV 90.0 89.4 91.4  PLT 207 225 263   Metabolic Panel:  Recent Labs Lab 05/11/16 1436 05/11/16 1809 05/12/16 0657  NA 140  --  141  K 3.9  --  4.1  CL 113*  --  112*  CO2 21*  --  24  GLUCOSE 68  --  80  BUN 14  --  8  CREATININE 0.88  --  0.70  CALCIUM 8.8*  --  8.5*  ALT  --  34 45  ALKPHOS  --  138* 144*  BILITOT  --  0.4 0.3  PROT  --  6.2* 6.0*  ALBUMIN  --  3.0* 2.8*  LIPASE  --  12  --    Cardiac Labs:  Recent Labs Lab 05/11/16 1442 05/11/16 1809 05/11/16 2312 05/12/16 0657  TROPIPOC 0.00  --   --   --   TROPONINI  --  <0.03 <0.03 <0.03    Medications:   Scheduled Medications: . ALPRAZolam  1 mg Oral TID  . cefTRIAXone (ROCEPHIN)  IV  1 g Intravenous Q24H  . escitalopram  10  mg Oral Daily  . famotidine  40 mg Oral Daily  . gabapentin  300 mg Oral TID  . pantoprazole  40 mg Oral QHS   PRN Medications: acetaminophen, albuterol, gi cocktail, metoCLOPramide  Assessment/Plan: Pt is a 50 y.o. yo female with a PMHx of Roux-en-Y gasatric bypass, hiatal hernia, GERD, albinism who was admitted on 05/11/2016 with symptoms of CP, epigastric pain, and diziness, which was determined to be secondary to GERD and likely gastric stricture.  1) Symptomatic Anemia: Hg improved to 10.7, down from 12.5 on 03/14/16 (Care Everywhere). FOBT negative. Likely 2/2 vitamin/Fe deficiency in setting of gastric bypass - outpt w/u and vitamin mineral supplementation  2) Atypical chest pain: Non-ACS w/ normal EKG and neg trops. Likely GERD, resolved w/ GI cocktail - GI cocktail PRN - continue protonix, famotidine, metoclopramide - adv diet slowly w/ concern for gastric stricture  3) Abdominal pain: S/p Roux-en-Y gastric bypass, s/p cholecystectomy. Possibly 2/2 gastritis vs GERD vs gastric stricture. - recommend GI f/u as outpt for evaluation w/ EGD  4) UTI - UCx - Rocephin IV,  narrow to nitrofurantoin 100mg  BID x 5d + fluconazole 150mg  once PRN yeast infxn  Length of Stay: 0 day(s) Dispo: Anticipated discharge today.  Carolynn Comment, MD Pager: [phone] (7AM-5PM) 05/12/2016, 3:21 PM

## 2016-05-12 NOTE — Discharge Summary (Signed)
Name: Tonya Young MRN: [id number] DOB: 05/07/1966 50 y.o. PCP: Pcp Not In System  Date of Admission: 05/11/2016  1:35 PM Date of Discharge: 05/12/2016 Attending Physician: Tyson Alias, MD  Discharge Diagnosis: 1. GERD 2. S/p gasatric bypass  Principal Problem:   Symptomatic anemia Active Problems:   Chest pain  Discharge Medications:   Medication List    TAKE these medications   ALPRAZolam 1 MG tablet Commonly known as:  XANAX Take 1 tablet by mouth 3 (three) times daily.   carisoprodol 350 MG tablet Commonly known as:  SOMA Take 1 tablet by mouth 2 (two) times daily.   escitalopram 10 MG tablet Commonly known as:  LEXAPRO Take 1 tablet by mouth daily.   Eszopiclone 3 MG Tabs Take 1 tablet by mouth at bedtime.   famotidine 40 MG tablet Commonly known as:  PEPCID Take 1 tablet by mouth daily.   fluconazole 150 MG tablet Commonly known as:  DIFLUCAN Take 1 tablet (150 mg total) by mouth once.   gabapentin 300 MG capsule Commonly known as:  NEURONTIN Take 1 capsule by mouth 3 (three) times daily.   metoCLOPramide 5 MG/5ML solution Commonly known as:  REGLAN Take 5 mLs by mouth 3 (three) times daily as needed for nausea.   nitrofurantoin (macrocrystal-monohydrate) 100 MG capsule Commonly known as:  MACROBID Take 1 capsule (100 mg total) by mouth 2 (two) times daily.   omeprazole 40 MG capsule Commonly known as:  PRILOSEC Take 1 capsule by mouth daily.   VENTOLIN HFA 108 (90 Base) MCG/ACT inhaler Generic drug:  albuterol Inhale 2 puffs into the lungs 2 (two) times daily.      Disposition and follow-up:   Ms.Tonya Young was discharged from [facility] in Stable condition.  At the hospital follow up visit please address:  1.  GERD: pt w/ symptomatic GERD from hiatal hernia, gastroparesis, and gastric bypass, assess symptoms and consider EGD for evaluation of strictures Anemia: f/u H&H, confirm compliance w/ B12 and Fe  supplements  2.  Labs / imaging needed at time of follow-up: CBC  3.  Pending labs/ test needing follow-up: none  Follow-up Appointments: Gastroenterology  Hospital Course by problem list: Principal Problem:   Symptomatic anemia Active Problems:   Chest pain   1. GERD: Pt presented with acute onset of atypical chest pain and epigastric abdominal pain. She was w/u for ACS which was negative. Symptoms were relieved with GI cocktail. She was discharged with her home medications for f/u w/ GI to consider possible stricture.  2. Anemia: She as found to have mildly low Hgb which rebounded on HD 1. She has a h/o iron deficient anemia and reports that she is not currently compliant with all her supplements. She was discharged w/ iron supplementation and encouraged to follow up with her PCP for further evaluation and management.  Discharge Vitals:   BP (!) 98/55 (BP Location: Left Arm)   Pulse (!) 57   Temp 98.3 F (36.8 C) (Oral)   Resp 17   LMP  (Approximate)   SpO2 92%   Pertinent Labs, Studies, and Procedures: Negative troponins, normal EKG.  Procedures Performed:  Dg Chest 2 View  Result Date: 05/11/2016 CLINICAL DATA:  Chest pain and dizziness.  Nausea EXAM: CHEST  2 VIEW COMPARISON:  None FINDINGS: Mild cardiac enlargement. No pleural effusion or edema. Diffuse increased interstitial prominence is identified. Asymmetric elevation of the right hemidiaphragm is identified. No focal airspace opacity identified. IMPRESSION: 1.  Diffuse interstitial prominence is identified throughout both lungs which may reflect chronic lung disease or superimposed atypical infection. Pulmonary edema may also have a similar appearance. 2. Asymmetric elevation of the right hemidiaphragm. Electronically Signed   By: Signa Kell M.D.   On: 05/11/2016 15:11   Discharge Instructions: Discharge Instructions    Call MD for:  persistant dizziness or light-headedness    Complete by:  As directed   Call MD  for:  persistant nausea and vomiting    Complete by:  As directed   Call MD for:  severe uncontrolled pain    Complete by:  As directed   Diet - low sodium heart healthy    Complete by:  As directed   Discharge instructions    Complete by:  As directed   Please make an appointment with your primary doctor or your Gastroenterologist for further evaluation of your abdominal pain. We have ruled out any problem with your heart, and feel that it is most likely that your symptoms are related to stricture in your stomach.  Your blood counts were also somewhat low. This is likely due to the decreased absorption of vitamins and minerals in your stomach and gut after your bypass surgery. Please make an appointment with your primary doctor in order to address the proper treatment for the low blood counts, which may include restarting your Iron supplements.   Increase activity slowly    Complete by:  As directed      Signed: Carolynn Comment, MD 05/12/2016, 4:02 PM   Pager: [phone]

## 2016-05-14 LAB — URINE CULTURE: Culture: >=100000 — AB

## 2016-11-01 ENCOUNTER — Encounter (HOSPITAL_COMMUNITY): Payer: Self-pay | Admitting: *Deleted

## 2016-11-01 ENCOUNTER — Emergency Department (HOSPITAL_COMMUNITY): Payer: Medicare Other

## 2016-11-01 ENCOUNTER — Emergency Department (HOSPITAL_COMMUNITY)
Admission: EM | Admit: 2016-11-01 | Discharge: 2016-11-01 | Disposition: A | Discharge status: Home / Self Care | Payer: Medicare Other | Attending: Emergency Medicine | Admitting: Emergency Medicine

## 2016-11-01 DIAGNOSIS — Z79899 Other long term (current) drug therapy: Secondary | ICD-10-CM | POA: Diagnosis not present

## 2016-11-01 DIAGNOSIS — R42 Dizziness and giddiness: Secondary | ICD-10-CM | POA: Diagnosis not present

## 2016-11-01 DIAGNOSIS — Z91040 Latex allergy status: Secondary | ICD-10-CM | POA: Insufficient documentation

## 2016-11-01 DIAGNOSIS — R0602 Shortness of breath: Secondary | ICD-10-CM | POA: Insufficient documentation

## 2016-11-01 DIAGNOSIS — R05 Cough: Secondary | ICD-10-CM

## 2016-11-01 DIAGNOSIS — R0781 Pleurodynia: Secondary | ICD-10-CM

## 2016-11-01 DIAGNOSIS — D649 Anemia, unspecified: Secondary | ICD-10-CM | POA: Insufficient documentation

## 2016-11-01 DIAGNOSIS — R059 Cough, unspecified: Secondary | ICD-10-CM

## 2016-11-01 LAB — BASIC METABOLIC PANEL
Anion gap: 7 (ref 5–15)
BUN: 17 mg/dL (ref 6–20)
CALCIUM: 9.1 mg/dL (ref 8.9–10.3)
CO2: 23 mmol/L (ref 22–32)
CREATININE: 0.98 mg/dL (ref 0.44–1.00)
Chloride: 112 mmol/L — ABNORMAL HIGH (ref 101–111)
GFR calc Af Amer: >60 mL/min (ref >60)
GLUCOSE: 76 mg/dL (ref 65–99)
Potassium: 3.6 mmol/L (ref 3.5–5.1)
SODIUM: 142 mmol/L (ref 135–145)

## 2016-11-01 LAB — CBC
HEMATOCRIT: 35.1 % — AB (ref 36.0–46.0)
Hemoglobin: 10.8 g/dL — ABNORMAL LOW (ref 12.0–15.0)
MCH: 26.9 pg (ref 26.0–34.0)
MCHC: 30.8 g/dL (ref 30.0–36.0)
MCV: 87.3 fL (ref 78.0–100.0)
PLATELETS: 205 10*3/uL (ref 150–400)
RBC: 4.02 MIL/uL (ref 3.87–5.11)
RDW: 15.5 % (ref 11.5–15.5)
WBC: 7.8 10*3/uL (ref 4.0–10.5)

## 2016-11-01 LAB — I-STAT BETA HCG BLOOD, ED (MC, WL, AP ONLY): I-stat hCG, quantitative: <5 m[IU]/mL (ref <5)

## 2016-11-01 NOTE — ED Notes (Signed)
Pt does not want to have further testing PA spoke with pt. Pt awake and alert and appears in no distress is aware of the risk with leaving still wants to leave

## 2016-11-01 NOTE — Discharge Instructions (Signed)
You have refused further testing including CT of your chest in emergency department. Please follow up with your doctor. Return if worsening symptoms.

## 2016-11-01 NOTE — ED Triage Notes (Signed)
Pt is here with dizziness and weakness. Recent treatment with pneumonia.  Pt reports a headache when sitting up.  EKG WNL per ems.  BP 102/68, 97RA HR 98.  CBG 166.

## 2016-11-01 NOTE — ED Provider Notes (Signed)
[facility]-EMERGENCY DEPT Provider Note   CSN: [id number] Arrival date & time: 11/01/16  1325     History   Chief Complaint Chief Complaint  Patient presents with  . pleurisy pain  . Dizziness    HPI Tonya Young is a 51 y.o. female.  HPI Tonya Young is a 51 y.o. female with history of anxiety, GERD, blindness, history of gastric bypass, anemia, presents to emergency department complaining of shortness of breath, pleuritic left-sided chest pain, episode of dizziness/lightheadedness today. Patient states she has been diagnosed with pneumonia approximately a week ago. She has been on Augmentin which she had to stop due to GI issues and finished Z-Pak. She states that her left-sided chest pain that is worsened with deep breathing and coughing is persisting. Today she was at work, states fell shortness of breath, used her inhaler, and shortly became dizzy. States she felt like she was going to faint, however denies syncope. Pt came here for further evaluation. States she still feels dizzy. Denies room or sensation of spinning. Continues to have cough. Denies nausea, vomiting. No diarrhea.   Past Medical History:  Diagnosis Date  . Anxiety and depression   . Blindness   . GERD (gastroesophageal reflux disease)   . Hernia, incisional   . S/P gastric bypass     Patient Active Problem List   Diagnosis Date Noted  . Symptomatic anemia 05/11/2016  . Chest pain 05/11/2016    Past Surgical History:  Procedure Laterality Date  . CESAREAN SECTION    . CHOLECYSTECTOMY    . GASTRIC BYPASS    . HERNIA REPAIR      OB History    No data available       Home Medications    Prior to Admission medications   Medication Sig Start Date End Date Taking? Authorizing Provider  ALPRAZolam Prudy Feeler) 1 MG tablet Take 1 tablet by mouth 3 (three) times daily. 04/22/16   Historical Provider, MD  carisoprodol (SOMA) 350 MG tablet Take 1 tablet by mouth 2 (two) times daily. 05/08/16   Historical  Provider, MD  escitalopram (LEXAPRO) 10 MG tablet Take 1 tablet by mouth daily. 02/04/16   Historical Provider, MD  Eszopiclone 3 MG TABS Take 1 tablet by mouth at bedtime. 04/22/16   Historical Provider, MD  famotidine (PEPCID) 40 MG tablet Take 1 tablet by mouth daily. 04/19/16   Historical Provider, MD  gabapentin (NEURONTIN) 300 MG capsule Take 1 capsule by mouth 3 (three) times daily. 04/22/16   Historical Provider, MD  metoCLOPramide (REGLAN) 5 MG/5ML solution Take 5 mLs by mouth 3 (three) times daily as needed for nausea. 04/19/16   Historical Provider, MD  omeprazole (PRILOSEC) 40 MG capsule Take 1 capsule by mouth daily. 02/20/16   Historical Provider, MD  VENTOLIN HFA 108 (90 Base) MCG/ACT inhaler Inhale 2 puffs into the lungs 2 (two) times daily. 05/04/16   Historical Provider, MD    Family History No family history on file.  Social History Social History  Substance Use Topics  . Smoking status: Never Smoker  . Smokeless tobacco: Never Used  . Alcohol use Yes     Comment: occasional     Allergies   Latex   Review of Systems Review of Systems  Constitutional: Negative for chills and fever.  Respiratory: Positive for cough and shortness of breath. Negative for chest tightness.   Cardiovascular: Positive for chest pain. Negative for palpitations and leg swelling.  Gastrointestinal: Negative for abdominal pain, diarrhea, nausea and vomiting.  Genitourinary: Negative for dysuria, flank pain, pelvic pain, vaginal bleeding, vaginal discharge and vaginal pain.  Musculoskeletal: Negative for arthralgias, myalgias, neck pain and neck stiffness.  Skin: Negative for rash.  Neurological: Positive for dizziness and light-headedness. Negative for weakness and headaches.  All other systems reviewed and are negative.    Physical Exam Updated Vital Signs BP 108/71 (BP Location: Right Arm)   Pulse (!) 57   Temp 98.1 F (36.7 C) (Oral)   Resp 16   SpO2 100%   Physical Exam    Constitutional: She appears well-developed and well-nourished. No distress.  HENT:  Head: Normocephalic.  Eyes: Conjunctivae are normal.  Neck: Neck supple.  Cardiovascular: Normal rate, regular rhythm and normal heart sounds.   Pulmonary/Chest: Effort normal and breath sounds normal. No respiratory distress. She has no wheezes. She has no rales. She exhibits no tenderness.  Abdominal: Soft. Bowel sounds are normal. She exhibits no distension. There is no tenderness. There is no rebound.  Musculoskeletal: She exhibits no edema.  Neurological: She is alert.  Skin: Skin is warm and dry.  Psychiatric: She has a normal mood and affect. Her behavior is normal.  Nursing note and vitals reviewed.    ED Treatments / Results  Labs (all labs ordered are listed, but only abnormal results are displayed) Labs Reviewed  BASIC METABOLIC PANEL - Abnormal; Notable for the following:       Result Value   Chloride 112 (*)    All other components within normal limits  CBC - Abnormal; Notable for the following:    Hemoglobin 10.8 (*)    HCT 35.1 (*)    All other components within normal limits  I-STAT BETA HCG BLOOD, ED ([facility], [facility], [facility] ONLY)    EKG  EKG Interpretation  Date/Time:  Thursday November 01 2016 14:05:47 EST Ventricular Rate:  65 PR Interval:  164 QRS Duration: 86 QT Interval:  398 QTC Calculation: 413 R Axis:   14 Text Interpretation:  Normal sinus rhythm Cannot rule out Anterior infarct , age undetermined Abnormal ECG No significant change since last tracing Confirmed by RAY MD, Duwayne HeckANIELLE [phone]([id number]) on 11/01/2016 4:04:38 PM       Radiology Dg Chest 2 View  Result Date: 11/01/2016 CLINICAL DATA:  Left-sided pleuritic chest pain and dizziness for the past day. No cardiopulmonary history. Nonsmoker. EXAM: CHEST  2 VIEW COMPARISON:  Chest x-ray of May 11, 2016 FINDINGS: The lungs are hypoinflated. The interstitial markings are coarse similar to that seen on the previous study. There is  no acute infiltrate. There is no pleural effusion. The heart and pulmonary vascularity are normal. There is prominent thoracic kyphosis which is stable. IMPRESSION: Chronically increased interstitial markings bilaterally. No significant change since the previous study. No CHF or alveolar pneumonia. Correlation with patient's clinical and laboratory values is needed. A chronic inflammatory or infectious process including mycobacterial etiologies could present in a similar fashion. Electronically Signed   By: David  SwazilandJordan M.D.   On: 11/01/2016 16:54    Procedures Procedures (including critical care time)  Medications Ordered in ED Medications - No data to display   Initial Impression / Assessment and Plan / ED Course  I have reviewed the triage vital signs and the nursing notes.  Pertinent labs & imaging results that were available during my care of the patient were reviewed by me and considered in my medical decision making (see chart for details).     Patient in emergency department with pleuritic left-sided chest pain,  cough, shortness of breath, dizziness onset today. Cough and pleuritic chest pain for a week, currently being treated for pneumonia. Patient's blood work and chest x-ray were obtained by triage nurse. Chest x-ray today showing chronic inflammatory changes, which are not new. No acute pneumonia is seen. I have reviewed patient's chart and care everywhere, patient with multiple prior chest x-rays and workup for cough, asthma, shortness of breath. She has had a prior CT scan in August of this past year which was negative other than inflammatory changes. Given patient's symptoms here today, I do believe patient requires further testing with CT angio to rule out PE which would also show any missed/occult pneumonia. Patient refuses CT scan, stating she does not want to wait for it and is ready to be discharged. I discussed with pt importance of staying and potential complication in case  this is a missed PE or pneumonia. PT stated she is aware and understands. Pt again requesting to be discharged stating she is not wanting to wait "forever."  Will dc home. At this time her VS are normal. She is not hypoxic. Not tachycardic. Afebrile. She will call her doctor in AM for apt for recheck. She will return if worseining symptoms. She is AAOx3, with mental capacity and understanding of her illness to make rational decisions.   Vitals:   11/01/16 1336 11/01/16 1736  BP: 110/70 108/71  Pulse: 77 (!) 57  Resp: 18 16  Temp: 98.1 F (36.7 C)   TempSrc: Oral   SpO2: 100% 100%     Final Clinical Impressions(s) / ED Diagnoses   Final diagnoses:  Shortness of breath  Pleuritic chest pain  Dizziness  Cough  Anemia, unspecified type    New Prescriptions New Prescriptions   No medications on file     Jaynie Crumble, PA-C 11/02/16 0021    Linwood Dibbles, MD 11/02/16 1235

## 2016-11-01 NOTE — ED Triage Notes (Signed)
Pt reports left pleurisy pain as she has bilateral and reports worse on left.  Developed dizziness this am and complains of pain to abdominal hernia.

## 2016-12-31 ENCOUNTER — Emergency Department (HOSPITAL_COMMUNITY)
Admission: EM | Admit: 2016-12-31 | Discharge: 2016-12-31 | Disposition: A | Discharge status: Home / Self Care | Payer: Medicare Other | Attending: Emergency Medicine | Admitting: Emergency Medicine

## 2016-12-31 ENCOUNTER — Encounter (HOSPITAL_COMMUNITY): Payer: Self-pay | Admitting: Nurse Practitioner

## 2016-12-31 ENCOUNTER — Emergency Department (HOSPITAL_COMMUNITY): Payer: Medicare Other

## 2016-12-31 DIAGNOSIS — J40 Bronchitis, not specified as acute or chronic: Secondary | ICD-10-CM | POA: Diagnosis not present

## 2016-12-31 DIAGNOSIS — R0602 Shortness of breath: Secondary | ICD-10-CM | POA: Diagnosis present

## 2016-12-31 DIAGNOSIS — J849 Interstitial pulmonary disease, unspecified: Secondary | ICD-10-CM

## 2016-12-31 DIAGNOSIS — Z91040 Latex allergy status: Secondary | ICD-10-CM | POA: Diagnosis not present

## 2016-12-31 LAB — CBC WITH DIFFERENTIAL/PLATELET
Basophils Absolute: 0 10*3/uL (ref 0.0–0.1)
Basophils Relative: 0 %
Eosinophils Absolute: 0.2 10*3/uL (ref 0.0–0.7)
Eosinophils Relative: 2 %
HEMATOCRIT: 34.3 % — AB (ref 36.0–46.0)
Hemoglobin: 10.9 g/dL — ABNORMAL LOW (ref 12.0–15.0)
Lymphocytes Relative: 15 %
Lymphs Abs: 1.4 10*3/uL (ref 0.7–4.0)
MCH: 26.7 pg (ref 26.0–34.0)
MCHC: 31.8 g/dL (ref 30.0–36.0)
MCV: 83.9 fL (ref 78.0–100.0)
MONO ABS: 0.5 10*3/uL (ref 0.1–1.0)
Monocytes Relative: 5 %
NEUTROS ABS: 7.4 10*3/uL (ref 1.7–7.7)
Neutrophils Relative %: 78 %
PLATELETS: 207 10*3/uL (ref 150–400)
RBC: 4.09 MIL/uL (ref 3.87–5.11)
RDW: 15.5 % (ref 11.5–15.5)
WBC: 9.4 10*3/uL (ref 4.0–10.5)

## 2016-12-31 LAB — COMPREHENSIVE METABOLIC PANEL
ALT: 13 U/L — AB (ref 14–54)
ANION GAP: 6 (ref 5–15)
AST: 19 U/L (ref 15–41)
Albumin: 3.6 g/dL (ref 3.5–5.0)
Alkaline Phosphatase: 96 U/L (ref 38–126)
BUN: 17 mg/dL (ref 6–20)
CHLORIDE: 109 mmol/L (ref 101–111)
CO2: 24 mmol/L (ref 22–32)
CREATININE: 1.04 mg/dL — AB (ref 0.44–1.00)
Calcium: 9 mg/dL (ref 8.9–10.3)
Glucose, Bld: 67 mg/dL (ref 65–99)
Potassium: 3.7 mmol/L (ref 3.5–5.1)
SODIUM: 139 mmol/L (ref 135–145)
Total Bilirubin: 0.4 mg/dL (ref 0.3–1.2)
Total Protein: 7.9 g/dL (ref 6.5–8.1)

## 2016-12-31 LAB — I-STAT CG4 LACTIC ACID, ED: Lactic Acid, Venous: 0.66 mmol/L (ref 0.5–1.9)

## 2016-12-31 LAB — LIPASE, BLOOD: LIPASE: 16 U/L (ref 11–51)

## 2016-12-31 LAB — BRAIN NATRIURETIC PEPTIDE: B Natriuretic Peptide: 52.6 pg/mL (ref 0.0–100.0)

## 2016-12-31 MED ORDER — PREDNISONE 20 MG PO TABS: ORAL_TABLET | ORAL | 0 refills | Status: DC

## 2016-12-31 MED ORDER — IPRATROPIUM-ALBUTEROL 0.5-2.5 (3) MG/3ML IN SOLN
3.0000 mL | Freq: Once | RESPIRATORY_TRACT | Status: AC
  Administered 2016-12-31: 3 mL via RESPIRATORY_TRACT
  Filled 2016-12-31: qty 3

## 2016-12-31 MED ORDER — SULFAMETHOXAZOLE-TRIMETHOPRIM 800-160 MG PO TABS
1.0000 | ORAL_TABLET | Freq: Once | ORAL | Status: AC
  Administered 2016-12-31: 1 via ORAL
  Filled 2016-12-31: qty 1

## 2016-12-31 MED ORDER — SULFAMETHOXAZOLE-TRIMETHOPRIM 800-160 MG PO TABS: 1.0000 | ORAL_TABLET | Freq: Two times a day (BID) | ORAL | 0 refills | Status: AC

## 2016-12-31 MED ORDER — PREDNISONE 20 MG PO TABS
40.0000 mg | ORAL_TABLET | Freq: Once | ORAL | Status: AC
  Administered 2016-12-31: 40 mg via ORAL
  Filled 2016-12-31: qty 2

## 2016-12-31 MED ORDER — SODIUM CHLORIDE 0.9 % IV SOLN: Freq: Once | INTRAVENOUS | Status: DC

## 2016-12-31 NOTE — ED Provider Notes (Signed)
[facility]-EMERGENCY DEPT Provider Note   CSN: [id number] Arrival date & time: 12/31/16  1325     History   Chief Complaint No chief complaint on file.   HPI Tonya Young is a 51 y.o. female.  HPI Patient with history of interstitial lung disease. Patient reports she does get frequent episodes of bronchitis and pneumonia. She reports she had a cough and shortness of breath this started about 3 weeks ago. She took a course of Augmentin. Her cough did not seem to improve much after completing her antibiotics. She did however feel somewhat improved and was able to return to work. She does report however last Sunday she spiked a fever up to 102. She then thought maybe she was getting the flu. She reports after returning to work today, she started to feel very generally weak and nauseated. She also started to develop left and central upper abdominal pain. She reports it made her worry that her hernia might have returned. She continues to have wet and frequent cough. She reports that the medics identify her instructions level to be 92% slightly placed her on oxygen but she does not use instructions baseline. Past Medical History:  Diagnosis Date  . Anxiety and depression   . Blindness   . GERD (gastroesophageal reflux disease)   . Hernia, incisional   . S/P gastric bypass     Patient Active Problem List   Diagnosis Date Noted  . Symptomatic anemia 05/11/2016  . Chest pain 05/11/2016    Past Surgical History:  Procedure Laterality Date  . CESAREAN SECTION    . CHOLECYSTECTOMY    . GASTRIC BYPASS    . HERNIA REPAIR      OB History    No data available       Home Medications    Prior to Admission medications   Medication Sig Start Date End Date Taking? Authorizing Provider  ALPRAZolam Prudy Feeler) 1 MG tablet Take 1 tablet by mouth 3 (three) times daily. 04/22/16  Yes Historical Provider, MD  carisoprodol (SOMA) 350 MG tablet Take 1 tablet by mouth 2 (two) times daily. 05/08/16  Yes  Historical Provider, MD  escitalopram (LEXAPRO) 10 MG tablet Take 1 tablet by mouth daily. 02/04/16  Yes Historical Provider, MD  famotidine (PEPCID) 40 MG tablet TAKE ONE TABLET BY MOUTH ONCE DAILY 12/18/16  Yes Historical Provider, MD  gabapentin (NEURONTIN) 300 MG capsule Take 1 capsule by mouth 3 (three) times daily. 04/22/16  Yes Historical Provider, MD  ibuprofen (ADVIL,MOTRIN) 800 MG tablet Take 800 mg by mouth every 8 (eight) hours as needed for moderate pain.  12/27/16  Yes Historical Provider, MD  lidocaine (XYLOCAINE) 5 % ointment Apply 1 application topically daily as needed for mild pain or moderate pain.  09/05/15  Yes Historical Provider, MD  metoCLOPramide (REGLAN) 5 MG/5ML solution Take 5 mLs by mouth 3 (three) times daily as needed for nausea. 04/19/16  Yes Historical Provider, MD  omeprazole (PRILOSEC) 40 MG capsule TAKE ONE CAPSULE BY MOUTH ONCE DAILY 11/28/16  Yes Historical Provider, MD  VENTOLIN HFA 108 (90 Base) MCG/ACT inhaler Inhale 2 puffs into the lungs 2 (two) times daily. 05/04/16  Yes Historical Provider, MD  zolpidem (AMBIEN) 10 MG tablet Take 10 mg by mouth at bedtime.   Yes Historical Provider, MD  predniSONE (DELTASONE) 20 MG tablet 2 tabs po daily x 4 days 12/31/16   Arby Barrette, MD  sulfamethoxazole-trimethoprim (BACTRIM DS,SEPTRA DS) 800-160 MG tablet Take 1 tablet by mouth 2 (two)  times daily. 12/31/16 01/07/17  Arby Barrette, MD    Family History No family history on file.  Social History Social History  Substance Use Topics  . Smoking status: Never Smoker  . Smokeless tobacco: Never Used  . Alcohol use Yes     Comment: occasional     Allergies   Latex   Review of Systems Review of Systems 10 Systems reviewed and are negative for acute change except as noted in the HPI.   Physical Exam Updated Vital Signs BP 107/75 (BP Location: Right Arm)   Pulse 73   Temp 98.1 F (36.7 C) (Oral)   Resp (!) 24   Ht 5\' 1"  (1.549 m)   Wt 167 lb (75.8 kg)    SpO2 95%   BMI 31.55 kg/m   Physical Exam  Constitutional: She is oriented to person, place, and time.  Patient appears mildly ill and fatigued. She does have a frequent wet cough. No significant respiratory distress at rest. Mental status is clear.  HENT:  Head: Normocephalic and atraumatic.  Mucous membranes pink and moist, posterior or first widely patent.  Eyes: EOM are normal. Pupils are equal, round, and reactive to light.  Patient does have some lateral nystagmus with eye motions. 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  Cardiovascular: Normal rate, regular rhythm, normal heart sounds and intact distal pulses.   Pulmonary/Chest:  Wet cough. Crackles at bases right greater than left.  Abdominal: Soft.  Endorses some diffuse tenderness in the mid and left upper quadrants. No guarding. She does have well-healed surgical scars.  Musculoskeletal: Normal range of motion. She exhibits no edema or tenderness.  Neurological: She is alert and oriented to person, place, and time. No cranial nerve deficit. She exhibits normal muscle tone. Coordination normal.  Skin: Skin is warm and dry.  Psychiatric: She has a normal mood and affect.     ED Treatments / Results  Labs (all labs ordered are listed, but only abnormal results are displayed) Labs Reviewed  COMPREHENSIVE METABOLIC PANEL - Abnormal; Notable for the following:       Result Value   Creatinine, Ser 1.04 (*)    ALT 13 (*)    All other components within normal limits  CBC WITH DIFFERENTIAL/PLATELET - Abnormal; Notable for the following:    Hemoglobin 10.9 (*)    HCT 34.3 (*)    All other components within normal limits  CULTURE, BLOOD (ROUTINE X 2)  CULTURE, BLOOD (ROUTINE X 2)  LIPASE, BLOOD  BRAIN NATRIURETIC PEPTIDE  URINALYSIS, ROUTINE W REFLEX MICROSCOPIC  INFLUENZA PANEL BY PCR (TYPE A & B)  I-STAT CG4 LACTIC ACID, ED    EKG  EKG Interpretation None       Radiology Dg Chest 2 View  Result Date:  12/31/2016 CLINICAL DATA:  Per EMS pt c/o weakness, dizziness, nausea, left sided diffuse abdominal pain, no pain with palpation. Diagnosed with bronchitis 2 weeks ago and hasn't felt well since. Hot to touch, chills. Co-workers called ambulance because pt.*comment was truncated* EXAM: CHEST  2 VIEW COMPARISON:  11/01/2016 and 05/11/2016. FINDINGS: Midline trachea. Borderline cardiomegaly. Mediastinal contours otherwise within normal limits. No pleural effusion or pneumothorax. Low lung volumes. Chronic pulmonary interstitial thickening. No lobar consolidation. IMPRESSION: Low lung volumes with chronic pulmonary interstitial thickening. Although this could represent pulmonary edema, lack of pleural fluid argues for either an infectious or inflammatory etiology. Interstitial lung disease is felt less likely but cannot be excluded. Given recurrent or chronic symptoms, chest CT may  be informative. Electronically Signed   By: Jeronimo GreavesKyle  Talbot M.D.   On: 12/31/2016 16:00    Procedures Procedures (including critical care time)  Medications Ordered in ED Medications  0.9 %  sodium chloride infusion (not administered)  sulfamethoxazole-trimethoprim (BACTRIM DS,SEPTRA DS) 800-160 MG per tablet 1 tablet (not administered)  predniSONE (DELTASONE) tablet 40 mg (not administered)  ipratropium-albuterol (DUONEB) 0.5-2.5 (3) MG/3ML nebulizer solution 3 mL (3 mLs Nebulization Given 12/31/16 1627)     Initial Impression / Assessment and Plan / ED Course  I have reviewed the triage vital signs and the nursing notes.  Pertinent labs & imaging results that were available during my care of the patient were reviewed by me and considered in my medical decision making (see chart for details).      Final Clinical Impressions(s) / ED Diagnoses   Final diagnoses:  Interstitial lung disease (HCC)  Bronchitis  Patient has a problems with chronic recurrent pneumonia\bronchitis symptoms. She reports a diagnosis of  interstitial lung disease. This was reviewed EMR. She states after completing an antibiotic over a week ago, she developed a fever and her cough became worse again. At this time she does not show signs of sepsis and there is not a focal pneumonia or leukocytosis. However, with the patient's ongoing wet cough, reported fever and increasing symptoms I do plan to treat empirically with Bactrim and a short course of prednisone. Patient reports adverse reaction to Levaquin. The patient is counseled on contacting her pulmonologist tomorrow to hopefully schedule recheck this week. They're to discuss progressive and worsening symptoms of chronic lung disease and possible other diagnostic testing such as CT recommended by radiology (patient does not wish to proceed with repeat diagnostic study at this time) and possible bronchoscopy for further diagnostic evaluation.  New Prescriptions New Prescriptions   PREDNISONE (DELTASONE) 20 MG TABLET    2 tabs po daily x 4 days   SULFAMETHOXAZOLE-TRIMETHOPRIM (BACTRIM DS,SEPTRA DS) 800-160 MG TABLET    Take 1 tablet by mouth 2 (two) times daily.     Arby BarretteMarcy Clever Geraldo, MD 12/31/16 1655

## 2016-12-31 NOTE — ED Notes (Signed)
Patient transported to X-ray 

## 2016-12-31 NOTE — ED Notes (Signed)
Pt's fiance is driving to come get her. Pt would like to review dc paperwork when fiance is here to get her.

## 2016-12-31 NOTE — ED Notes (Signed)
Bed: WA09 Expected date:  Expected time:  Means of arrival:  Comments: EMS-dizzy

## 2016-12-31 NOTE — ED Triage Notes (Addendum)
Per EMS pt c/o weakness, dizziness, nausea, left sided diffuse abdominal pain, no pain with palpation. Diagnosed with bronchitis 2 weeks ago and hasn't felt well since. Hot to touch, chills. Co-workers called ambulance because pt fell asleep at workstation, stumbling. Blind at baseline.

## 2016-12-31 NOTE — ED Notes (Addendum)
Pt assisted with female urinal per request. Pt verbalizes significant other 20 minutes away and will use call bell when at significant other at bedside.

## 2016-12-31 NOTE — ED Notes (Signed)
Awaiting for significant other prior to discharge.

## 2016-12-31 NOTE — ED Notes (Signed)
Pt refused flu swab & IV fluids.

## 2017-01-05 LAB — CULTURE, BLOOD (ROUTINE X 2)
CULTURE: NO GROWTH
CULTURE: NO GROWTH

## 2017-02-05 ENCOUNTER — Emergency Department (HOSPITAL_COMMUNITY): Payer: Medicare Other

## 2017-02-05 ENCOUNTER — Emergency Department (HOSPITAL_COMMUNITY)
Admission: EM | Admit: 2017-02-05 | Discharge: 2017-02-05 | Disposition: A | Discharge status: Home / Self Care | Payer: Medicare Other | Attending: Emergency Medicine | Admitting: Emergency Medicine

## 2017-02-05 ENCOUNTER — Encounter (HOSPITAL_COMMUNITY): Payer: Self-pay | Admitting: *Deleted

## 2017-02-05 DIAGNOSIS — Z91040 Latex allergy status: Secondary | ICD-10-CM | POA: Insufficient documentation

## 2017-02-05 DIAGNOSIS — Z79899 Other long term (current) drug therapy: Secondary | ICD-10-CM | POA: Diagnosis not present

## 2017-02-05 DIAGNOSIS — Y939 Activity, unspecified: Secondary | ICD-10-CM | POA: Insufficient documentation

## 2017-02-05 DIAGNOSIS — Y929 Unspecified place or not applicable: Secondary | ICD-10-CM | POA: Diagnosis not present

## 2017-02-05 DIAGNOSIS — S52501A Unspecified fracture of the lower end of right radius, initial encounter for closed fracture: Secondary | ICD-10-CM | POA: Insufficient documentation

## 2017-02-05 DIAGNOSIS — R51 Headache: Secondary | ICD-10-CM | POA: Insufficient documentation

## 2017-02-05 DIAGNOSIS — W19XXXA Unspecified fall, initial encounter: Secondary | ICD-10-CM

## 2017-02-05 DIAGNOSIS — W010XXA Fall on same level from slipping, tripping and stumbling without subsequent striking against object, initial encounter: Secondary | ICD-10-CM | POA: Diagnosis not present

## 2017-02-05 DIAGNOSIS — Y999 Unspecified external cause status: Secondary | ICD-10-CM | POA: Diagnosis not present

## 2017-02-05 DIAGNOSIS — S6991XA Unspecified injury of right wrist, hand and finger(s), initial encounter: Secondary | ICD-10-CM | POA: Diagnosis present

## 2017-02-05 LAB — COMPREHENSIVE METABOLIC PANEL
ALT: 8 U/L — ABNORMAL LOW (ref 14–54)
ANION GAP: 3 — AB (ref 5–15)
AST: 24 U/L (ref 15–41)
Albumin: 2.9 g/dL — ABNORMAL LOW (ref 3.5–5.0)
Alkaline Phosphatase: 91 U/L (ref 38–126)
BUN: 14 mg/dL (ref 6–20)
CO2: 26 mmol/L (ref 22–32)
Calcium: 7.8 mg/dL — ABNORMAL LOW (ref 8.9–10.3)
Chloride: 113 mmol/L — ABNORMAL HIGH (ref 101–111)
Creatinine, Ser: 0.75 mg/dL (ref 0.44–1.00)
GLUCOSE: 75 mg/dL (ref 65–99)
POTASSIUM: 4.2 mmol/L (ref 3.5–5.1)
SODIUM: 142 mmol/L (ref 135–145)
Total Bilirubin: 0.5 mg/dL (ref 0.3–1.2)
Total Protein: 6.1 g/dL — ABNORMAL LOW (ref 6.5–8.1)

## 2017-02-05 LAB — CBC
HCT: 34.3 % — ABNORMAL LOW (ref 36.0–46.0)
Hemoglobin: 10.5 g/dL — ABNORMAL LOW (ref 12.0–15.0)
MCH: 26.6 pg (ref 26.0–34.0)
MCHC: 30.6 g/dL (ref 30.0–36.0)
MCV: 86.8 fL (ref 78.0–100.0)
PLATELETS: 225 10*3/uL (ref 150–400)
RBC: 3.95 MIL/uL (ref 3.87–5.11)
RDW: 15.8 % — AB (ref 11.5–15.5)
WBC: 7.3 10*3/uL (ref 4.0–10.5)

## 2017-02-05 MED ORDER — SODIUM CHLORIDE 0.9 % IV BOLUS (SEPSIS)
1000.0000 mL | Freq: Once | INTRAVENOUS | Status: AC
  Administered 2017-02-05: 1000 mL via INTRAVENOUS

## 2017-02-05 MED ORDER — OXYCODONE-ACETAMINOPHEN 5-325 MG PO TABS
1.0000 | ORAL_TABLET | Freq: Once | ORAL | Status: AC
  Administered 2017-02-05: 1 via ORAL
  Filled 2017-02-05: qty 1

## 2017-02-05 NOTE — ED Notes (Signed)
Pt sleeping still waiting on the ride home.

## 2017-02-05 NOTE — ED Provider Notes (Signed)
[facility]-EMERGENCY DEPT Provider Note   CSN: [id number] Arrival date & time: 02/05/17  0857     History   Chief Complaint Chief Complaint  Patient presents with  . Facial Pain  . Fall  . Shoulder Pain    HPI Tonya Young is a 51 y.o. female.  HPI Patient presents to the emergency department complains of ongoing right wrist pain after falling Sunday.  She states that she slipped and injured her right shoulder and her right wrist.  She denies head injury.  No chest pain.  No abdominal pain.  She reports presenting to the ER finally today because of ongoing discomfort and pain.  Symptoms are worse with palpation and range of motion of her right wrist.   Past Medical History:  Diagnosis Date  . Anxiety and depression   . Blindness   . GERD (gastroesophageal reflux disease)   . Hernia, incisional   . S/P gastric bypass     Patient Active Problem List   Diagnosis Date Noted  . Symptomatic anemia 05/11/2016  . Chest pain 05/11/2016    Past Surgical History:  Procedure Laterality Date  . CESAREAN SECTION    . CHOLECYSTECTOMY    . GASTRIC BYPASS    . HERNIA REPAIR      OB History    No data available       Home Medications    Prior to Admission medications   Medication Sig Start Date End Date Taking? Authorizing Provider  ALPRAZolam Prudy Feeler) 1 MG tablet Take 1 tablet by mouth 3 (three) times daily. 04/22/16  Yes Historical Provider, MD  carisoprodol (SOMA) 350 MG tablet Take 1 tablet by mouth 2 (two) times daily. 05/08/16  Yes Historical Provider, MD  escitalopram (LEXAPRO) 10 MG tablet Take 1 tablet by mouth daily. 02/04/16  Yes Historical Provider, MD  famotidine (PEPCID) 40 MG tablet TAKE ONE TABLET BY MOUTH ONCE DAILY 12/18/16  Yes Historical Provider, MD  gabapentin (NEURONTIN) 300 MG capsule Take 1 capsule by mouth 3 (three) times daily. 04/22/16  Yes Historical Provider, MD  ibuprofen (ADVIL,MOTRIN) 800 MG tablet Take 800 mg by mouth every 8 (eight) hours as needed  for moderate pain.  12/27/16  Yes Historical Provider, MD  lidocaine (XYLOCAINE) 5 % ointment Apply 1 application topically daily as needed for mild pain or moderate pain.  09/05/15  Yes Historical Provider, MD  metoCLOPramide (REGLAN) 5 MG/5ML solution Take 5 mLs by mouth 3 (three) times daily as needed for nausea. 04/19/16  Yes Historical Provider, MD  omeprazole (PRILOSEC) 40 MG capsule TAKE ONE CAPSULE BY MOUTH ONCE DAILY 11/28/16  Yes Historical Provider, MD  VENTOLIN HFA 108 (90 Base) MCG/ACT inhaler Inhale 2 puffs into the lungs 2 (two) times daily. 05/04/16  Yes Historical Provider, MD  zolpidem (AMBIEN) 5 MG tablet Take 5 mg by mouth at bedtime as needed for sleep. 01/16/17  Yes Historical Provider, MD  predniSONE (DELTASONE) 20 MG tablet 2 tabs po daily x 4 days Patient not taking: Reported on 02/05/2017 12/31/16   Arby Barrette, MD    Family History History reviewed. No pertinent family history.  Social History Social History  Substance Use Topics  . Smoking status: Never Smoker  . Smokeless tobacco: Never Used  . Alcohol use Yes     Comment: occasional     Allergies   Latex   Review of Systems Review of Systems  All other systems reviewed and are negative.    Physical Exam Updated Vital Signs BP  110/70 (BP Location: Right Arm)   Pulse 65   Temp 98 F (36.7 C) (Oral)   Resp 17   SpO2 93%   Physical Exam  Constitutional: She is oriented to person, place, and time. She appears well-developed and well-nourished. No distress.  HENT:  Head: Normocephalic and atraumatic.  Eyes: EOM are normal.  Neck: Normal range of motion.  Cardiovascular: Normal rate and regular rhythm.   Pulmonary/Chest: Effort normal and breath sounds normal.  Abdominal: Soft. She exhibits no distension. There is no tenderness.  Musculoskeletal:  Full range of motion bilateral shoulders and bilateral elbows.  Mild painful range of motion of the right wrist with small bruising noted on the ulnar  side of her distal right wrist.  Small bruises noted to the right lateral shoulder without painful range of motion.  Full range of motion bilateral hips, knees, ankles.  Neurological: She is alert and oriented to person, place, and time.  Skin: Skin is warm and dry.  Psychiatric: She has a normal mood and affect. Judgment normal.  Nursing note and vitals reviewed.    ED Treatments / Results  Labs (all labs ordered are listed, but only abnormal results are displayed) Labs Reviewed  CBC - Abnormal; Notable for the following:       Result Value   Hemoglobin 10.5 (*)    HCT 34.3 (*)    RDW 15.8 (*)    All other components within normal limits  COMPREHENSIVE METABOLIC PANEL - Abnormal; Notable for the following:    Chloride 113 (*)    Calcium 7.8 (*)    Total Protein 6.1 (*)    Albumin 2.9 (*)    ALT 8 (*)    Anion gap 3 (*)    All other components within normal limits    EKG  EKG Interpretation None       Radiology Dg Forearm Right  Result Date: 02/05/2017 CLINICAL DATA:  Fall.  Pain . EXAM: RIGHT FOREARM - 2 VIEW COMPARISON:  None. FINDINGS: Nondisplaced distal radial fracture appears to be present. Forearm is otherwise intact. Diffuse osteopenia. IMPRESSION: Nondisplaced distal radial fracture appears be present . Electronically Signed   By: Maisie Fus  Register   On: 02/05/2017 10:32   Ct Head Wo Contrast  Result Date: 02/05/2017 CLINICAL DATA:  Status post fall 02/03/2017. Facial pain. Initial encounter. EXAM: CT HEAD WITHOUT CONTRAST CT CERVICAL SPINE WITHOUT CONTRAST TECHNIQUE: Multidetector CT imaging of the head and cervical spine was performed following the standard protocol without intravenous contrast. Multiplanar CT image reconstructions of the cervical spine were also generated. COMPARISON:  None. FINDINGS: CT HEAD FINDINGS Brain: Appears normal without hemorrhage, infarct, mass lesion, mass effect, midline shift or abnormal extra-axial fluid collection. No hydrocephalus  or pneumocephalus. Vascular: Negative. Skull: Intact. Sinuses/Orbits: Negative. Other: None. CT CERVICAL SPINE FINDINGS Alignment: Normal. Skull base and vertebrae: No acute fracture. No primary bone lesion or focal pathologic process. Soft tissues and spinal canal: No prevertebral fluid or swelling. No visible canal hematoma. Disc levels: Mild loss of disc space height is noted at C5-6. Intervertebral disc space height is otherwise normal. Upper chest: Not included on the exam. Other: None. IMPRESSION: No acute abnormality head or cervical spine. Mild loss of disc space height at C5-6 without central canal stenosis. Electronically Signed   By: Drusilla Kanner M.D.   On: 02/05/2017 10:28   Ct Cervical Spine Wo Contrast  Result Date: 02/05/2017 CLINICAL DATA:  Status post fall 02/03/2017. Facial pain. Initial encounter. EXAM:  CT HEAD WITHOUT CONTRAST CT CERVICAL SPINE WITHOUT CONTRAST TECHNIQUE: Multidetector CT imaging of the head and cervical spine was performed following the standard protocol without intravenous contrast. Multiplanar CT image reconstructions of the cervical spine were also generated. COMPARISON:  None. FINDINGS: CT HEAD FINDINGS Brain: Appears normal without hemorrhage, infarct, mass lesion, mass effect, midline shift or abnormal extra-axial fluid collection. No hydrocephalus or pneumocephalus. Vascular: Negative. Skull: Intact. Sinuses/Orbits: Negative. Other: None. CT CERVICAL SPINE FINDINGS Alignment: Normal. Skull base and vertebrae: No acute fracture. No primary bone lesion or focal pathologic process. Soft tissues and spinal canal: No prevertebral fluid or swelling. No visible canal hematoma. Disc levels: Mild loss of disc space height is noted at C5-6. Intervertebral disc space height is otherwise normal. Upper chest: Not included on the exam. Other: None. IMPRESSION: No acute abnormality head or cervical spine. Mild loss of disc space height at C5-6 without central canal stenosis.  Electronically Signed   By: Drusilla Kanner M.D.   On: 02/05/2017 10:28   Dg Hand Complete Right  Result Date: 02/05/2017 CLINICAL DATA:  Hand pain after a fall. EXAM: RIGHT HAND - COMPLETE 3+ VIEW COMPARISON:  None. FINDINGS: Study limited by superimposition of the fingers on the lateral film. Within this limitation, no evidence for an acute fracture. No subluxation or dislocation. Deformity of the fifth metacarpal suggests remote trauma. IMPRESSION: Negative. Electronically Signed   By: Kennith Center M.D.   On: 02/05/2017 10:32    Procedures Procedures (including critical care time)   ++++++++++++++++++++++++++++++++++++++++  SPLINT APPLICATION Authorized by: Lyanne Co Consent: Verbal consent obtained. Risks and benefits: risks, benefits and alternatives were discussed Consent given by: patient Splint applied by: orthopedic technician Location details: right upper extremity Splint type: sugartong Supplies used: orthoglass Post-procedure: The splinted body part was neurovascularly unchanged following the procedure. Patient tolerance: Patient tolerated the procedure well with no immediate complications.  ++++++++++++++++++++++++++++++++++++++++++++++++   Medications Ordered in ED Medications  sodium chloride 0.9 % bolus 1,000 mL (1,000 mLs Intravenous New Bag/Given 02/05/17 1213)  oxyCODONE-acetaminophen (PERCOCET/ROXICET) 5-325 MG per tablet 1 tablet (1 tablet Oral Given 02/05/17 1101)     Initial Impression / Assessment and Plan / ED Course  I have reviewed the triage vital signs and the nursing notes.  Pertinent labs & imaging results that were available during my care of the patient were reviewed by me and considered in my medical decision making (see chart for details).     Patient splinted in a sugar tong.  Orthopedic hand surgery follow-up.  Feels better after IV fluids.  Otherwise no significant abnormalities noted on labs.  No indication for additional workup at  this time.  Close primary care follow-up.  Final Clinical Impressions(s) / ED Diagnoses   Final diagnoses:  Fall, initial encounter  Closed fracture of distal end of right radius, unspecified fracture morphology, initial encounter    New Prescriptions New Prescriptions   No medications on file     Azalia Bilis, MD 02/05/17 1311

## 2017-02-05 NOTE — ED Triage Notes (Signed)
Patient is alert and oriented x4.  She is post 3 days from a fall.  Patient continues to have pain with main pain localized in her face.  Currently she rates her pain 2 of 10.

## 2017-02-05 NOTE — ED Triage Notes (Signed)
Pt states she fell on Sunday while making the bed hit her rt shoulder and hand has , tennis ball size bruise to rt shoulder and rt nad has bruise to back of it , both areas are painful to touch and move,

## 2017-02-05 NOTE — ED Notes (Signed)
To x-ray

## 2017-02-05 NOTE — ED Notes (Signed)
Bed: ZO10 Expected date:  Expected time:  Means of arrival:  Comments: EMS FAll

## 2017-06-15 IMAGING — CR DG HAND COMPLETE 3+V*R*
3 series · 3 of 3 positions shown · non-contrast
Comparison: None.

CLINICAL DATA: Hand pain after a fall.

EXAM:
RIGHT HAND - COMPLETE 3+ VIEW

[x hand pa right]
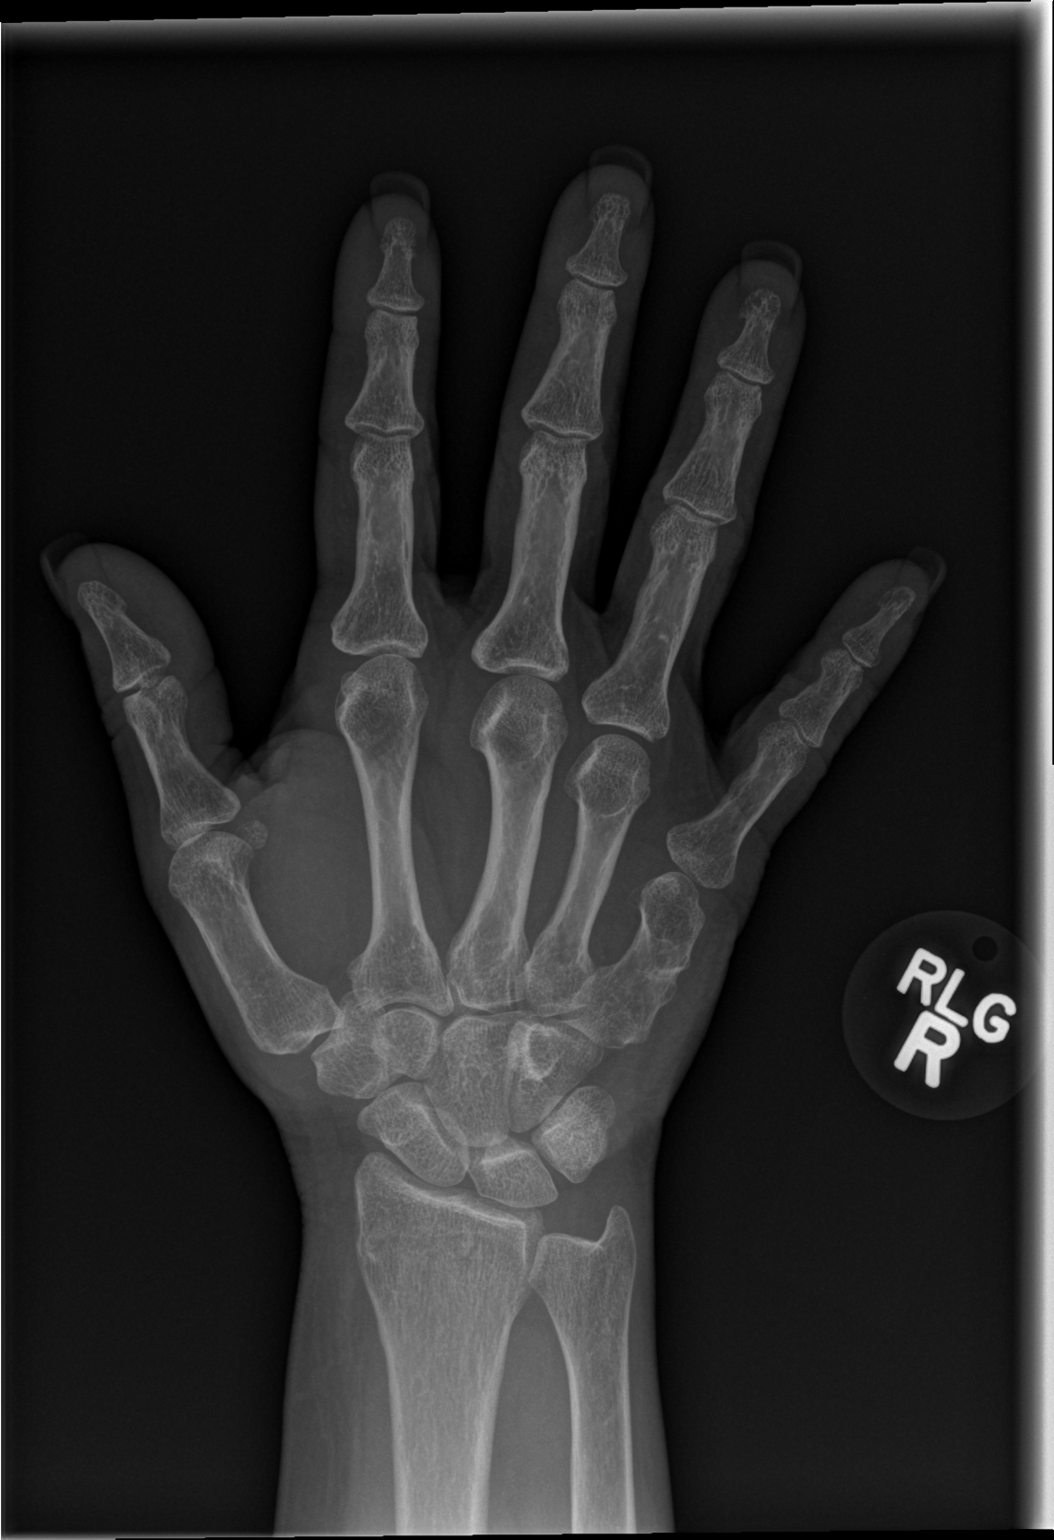

[x hand obl right]
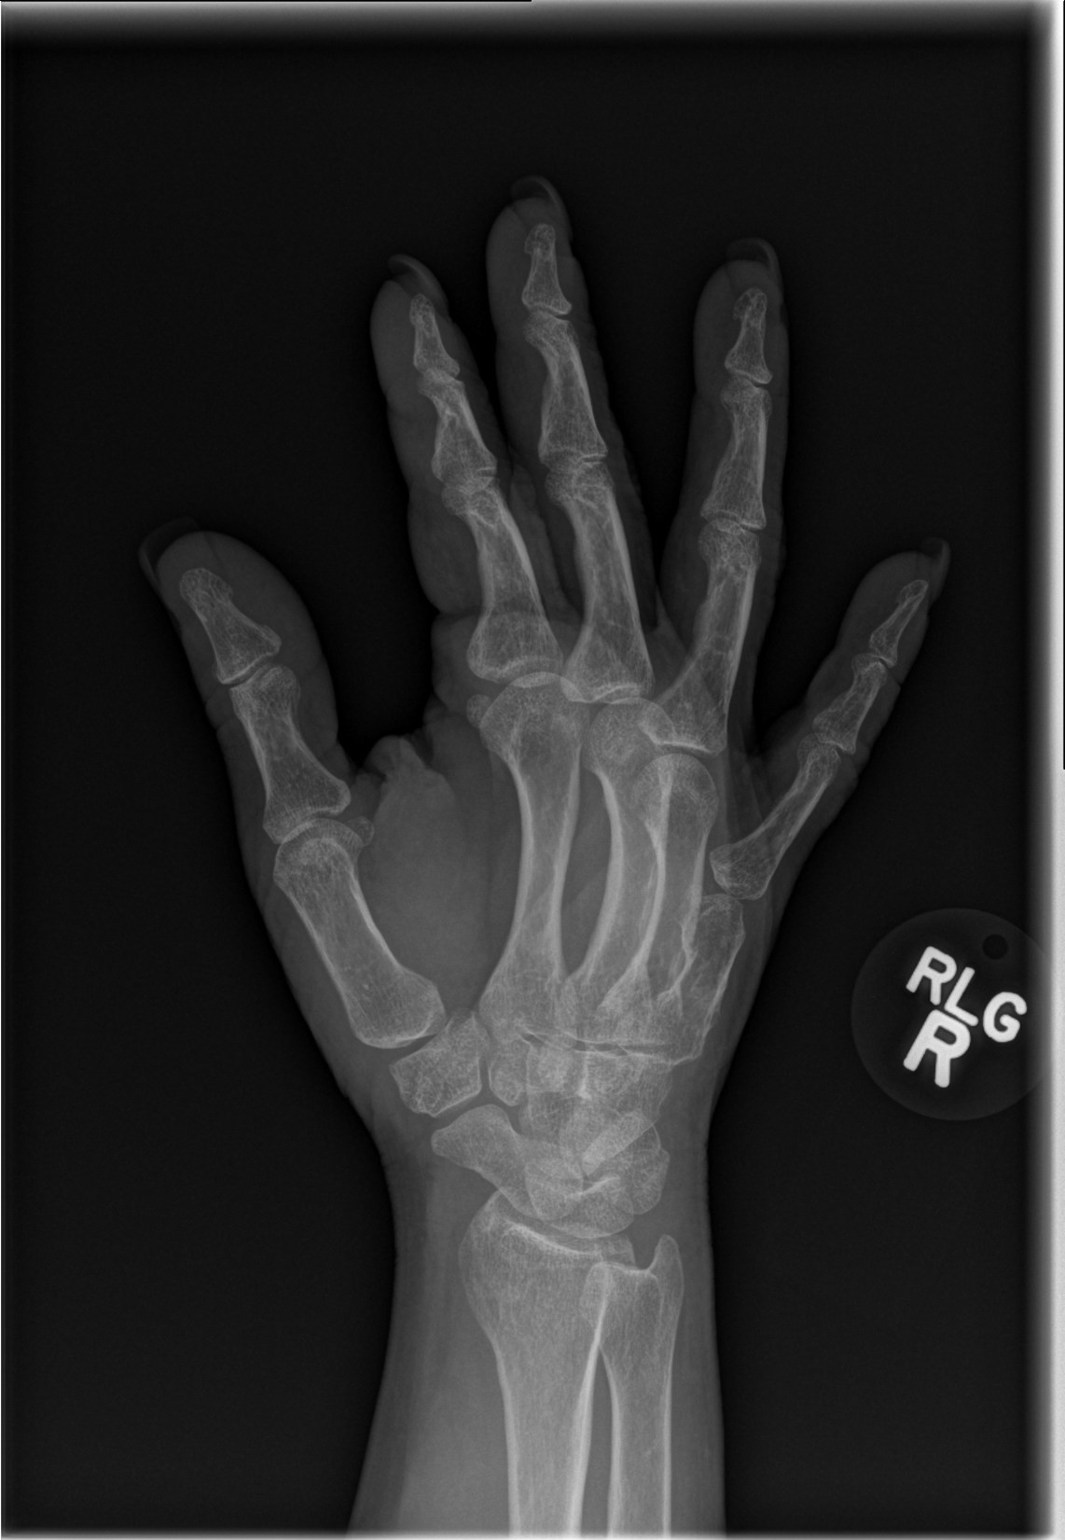

[x hand lat right]
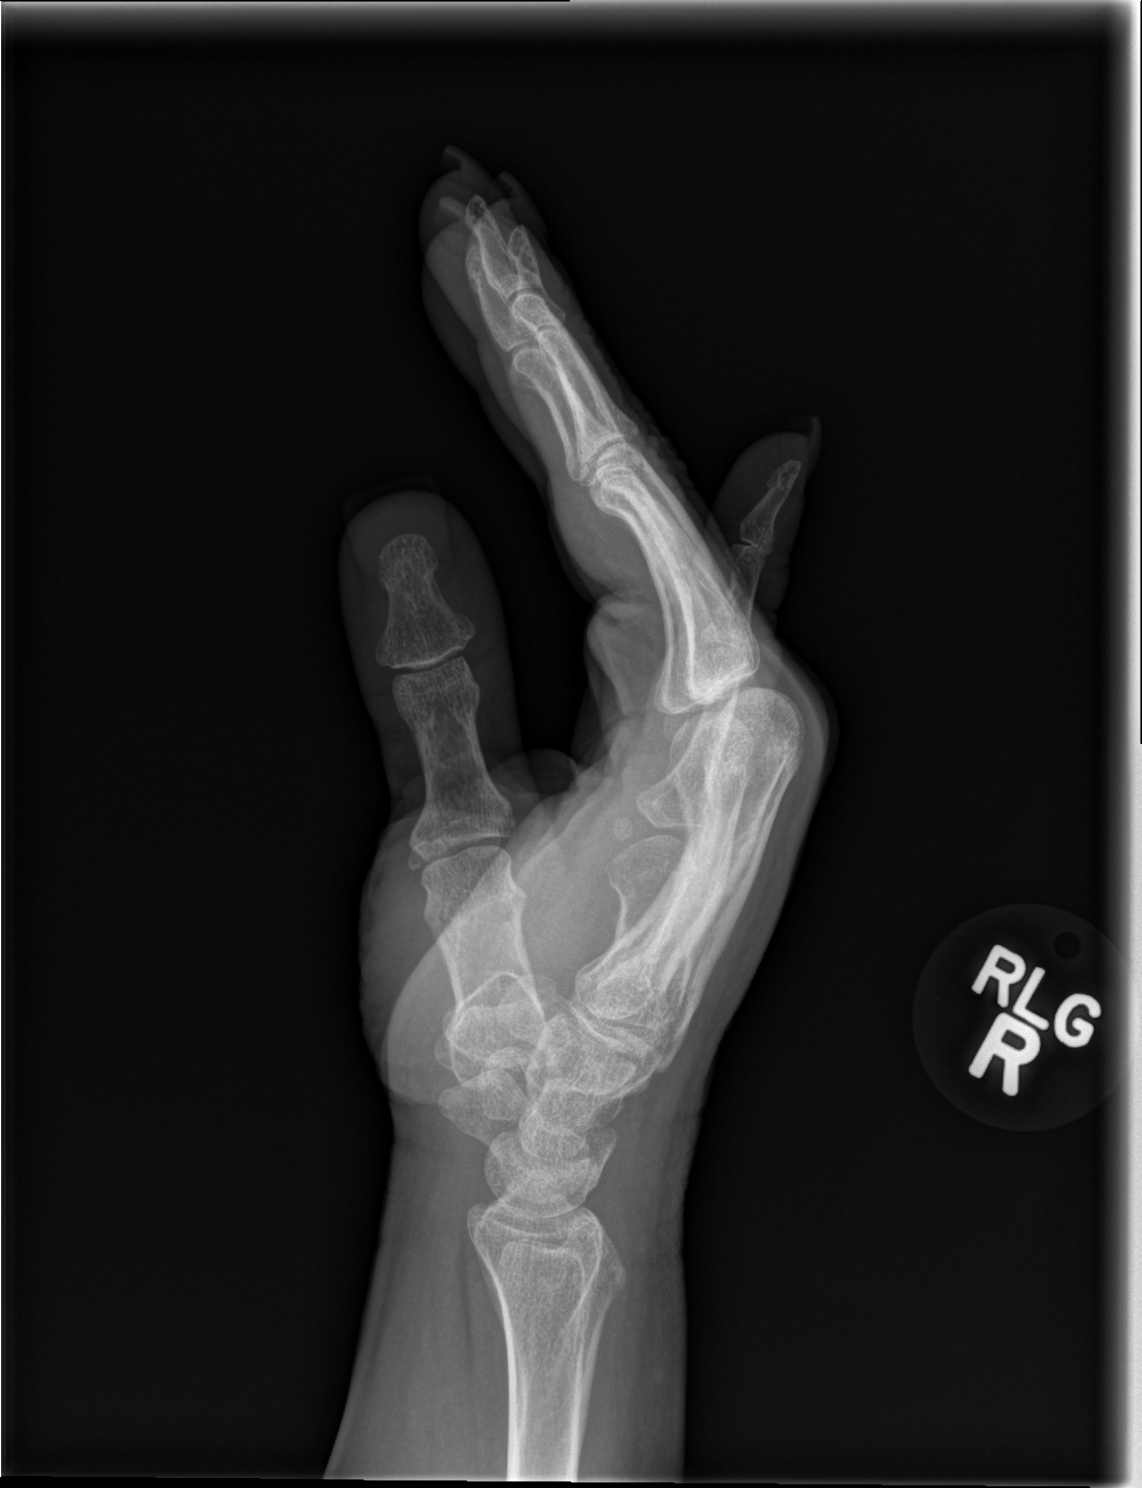

[3 of 3 positions shown; findings below may reference images not displayed]

FINDINGS: Study limited by superimposition of the fingers on the lateral film.
Within this limitation, no evidence for an acute fracture. No
subluxation or dislocation. Deformity of the fifth metacarpal
suggests remote trauma.
IMPRESSION: Negative.

## 2017-09-21 ENCOUNTER — Emergency Department (HOSPITAL_COMMUNITY)
Admission: EM | Admit: 2017-09-21 | Discharge: 2017-09-21 | Disposition: A | Discharge status: Home / Self Care | Payer: Medicare Other | Attending: Emergency Medicine | Admitting: Emergency Medicine

## 2017-09-21 ENCOUNTER — Other Ambulatory Visit: Payer: Self-pay

## 2017-09-21 ENCOUNTER — Encounter (HOSPITAL_COMMUNITY): Payer: Self-pay

## 2017-09-21 ENCOUNTER — Emergency Department (HOSPITAL_COMMUNITY): Payer: Medicare Other

## 2017-09-21 DIAGNOSIS — R918 Other nonspecific abnormal finding of lung field: Secondary | ICD-10-CM

## 2017-09-21 DIAGNOSIS — J0100 Acute maxillary sinusitis, unspecified: Secondary | ICD-10-CM | POA: Diagnosis not present

## 2017-09-21 DIAGNOSIS — H81399 Other peripheral vertigo, unspecified ear: Secondary | ICD-10-CM | POA: Insufficient documentation

## 2017-09-21 DIAGNOSIS — R42 Dizziness and giddiness: Secondary | ICD-10-CM | POA: Diagnosis present

## 2017-09-21 DIAGNOSIS — Z91040 Latex allergy status: Secondary | ICD-10-CM | POA: Diagnosis not present

## 2017-09-21 DIAGNOSIS — Z79899 Other long term (current) drug therapy: Secondary | ICD-10-CM | POA: Diagnosis not present

## 2017-09-21 HISTORY — DX: Unspecified injury of unspecified wrist, hand and finger(s), initial encounter: S69.90XA

## 2017-09-21 LAB — URINALYSIS, ROUTINE W REFLEX MICROSCOPIC
Bilirubin Urine: NEGATIVE
GLUCOSE, UA: 50 mg/dL — AB
HGB URINE DIPSTICK: NEGATIVE
Ketones, ur: NEGATIVE mg/dL
Nitrite: POSITIVE — AB
PH: 5 (ref 5.0–8.0)
Protein, ur: NEGATIVE mg/dL
SPECIFIC GRAVITY, URINE: 1.008 (ref 1.005–1.030)

## 2017-09-21 LAB — COMPREHENSIVE METABOLIC PANEL
ALBUMIN: 2.7 g/dL — AB (ref 3.5–5.0)
ALT: 10 U/L — AB (ref 14–54)
AST: 25 U/L (ref 15–41)
Alkaline Phosphatase: 80 U/L (ref 38–126)
BILIRUBIN TOTAL: 0.6 mg/dL (ref 0.3–1.2)
BUN: 10 mg/dL (ref 6–20)
CALCIUM: 6.5 mg/dL — AB (ref 8.9–10.3)
CHLORIDE: 118 mmol/L — AB (ref 101–111)
CO2: 21 mmol/L — ABNORMAL LOW (ref 22–32)
CREATININE: 0.62 mg/dL (ref 0.44–1.00)
GFR calc Af Amer: >60 mL/min (ref >60)
Glucose, Bld: 56 mg/dL — ABNORMAL LOW (ref 65–99)
Potassium: 3.3 mmol/L — ABNORMAL LOW (ref 3.5–5.1)
Sodium: 141 mmol/L (ref 135–145)
Total Protein: 5.9 g/dL — ABNORMAL LOW (ref 6.5–8.1)

## 2017-09-21 LAB — CBG MONITORING, ED
GLUCOSE-CAPILLARY: 97 mg/dL (ref 65–99)
Glucose-Capillary: 61 mg/dL — ABNORMAL LOW (ref 65–99)

## 2017-09-21 LAB — CBC
HCT: 39 % (ref 36.0–46.0)
Hemoglobin: 12.4 g/dL (ref 12.0–15.0)
MCH: 27.7 pg (ref 26.0–34.0)
MCHC: 31.8 g/dL (ref 30.0–36.0)
MCV: 87.2 fL (ref 78.0–100.0)
PLATELETS: 240 10*3/uL (ref 150–400)
RBC: 4.47 MIL/uL (ref 3.87–5.11)
RDW: 14.9 % (ref 11.5–15.5)
WBC: 6 10*3/uL (ref 4.0–10.5)

## 2017-09-21 LAB — TSH: TSH: 0.14 u[IU]/mL — ABNORMAL LOW (ref 0.350–4.500)

## 2017-09-21 LAB — I-STAT TROPONIN, ED: TROPONIN I, POC: 0 ng/mL (ref 0.00–0.08)

## 2017-09-21 LAB — LIPASE, BLOOD: LIPASE: 18 U/L (ref 11–51)

## 2017-09-21 LAB — T4, FREE: FREE T4: 0.81 ng/dL (ref 0.61–1.12)

## 2017-09-21 MED ORDER — DEXTROSE 50 % IV SOLN
50.0000 mL | Freq: Once | INTRAVENOUS | Status: AC
  Administered 2017-09-21: 50 mL via INTRAVENOUS
  Filled 2017-09-21: qty 50

## 2017-09-21 MED ORDER — LEVOFLOXACIN 500 MG PO TABS: 500.0000 mg | ORAL_TABLET | Freq: Every day | ORAL | 0 refills | Status: AC

## 2017-09-21 MED ORDER — MECLIZINE HCL 25 MG PO TABS: 25.0000 mg | ORAL_TABLET | Freq: Three times a day (TID) | ORAL | 0 refills | Status: AC | PRN

## 2017-09-21 MED ORDER — MECLIZINE HCL 25 MG PO TABS
25.0000 mg | ORAL_TABLET | Freq: Once | ORAL | Status: AC
  Administered 2017-09-21: 25 mg via ORAL
  Filled 2017-09-21: qty 1

## 2017-09-21 MED ORDER — SODIUM CHLORIDE 0.9 % IV BOLUS (SEPSIS)
1000.0000 mL | Freq: Once | INTRAVENOUS | Status: AC
  Administered 2017-09-21: 1000 mL via INTRAVENOUS

## 2017-09-21 MED ORDER — LORATADINE 10 MG PO TABS: 10.0000 mg | ORAL_TABLET | Freq: Every day | ORAL | 0 refills | Status: AC

## 2017-09-21 MED ORDER — FLUTICASONE PROPIONATE 50 MCG/ACT NA SUSP: 2.0000 | Freq: Every day | NASAL | 0 refills | Status: AC

## 2017-09-21 MED ORDER — LEVOFLOXACIN IN D5W 750 MG/150ML IV SOLN
750.0000 mg | Freq: Once | INTRAVENOUS | Status: AC
  Administered 2017-09-21: 750 mg via INTRAVENOUS
  Filled 2017-09-21: qty 150

## 2017-09-21 NOTE — ED Notes (Signed)
Pt not able to stand to perform ortho stat at this time. Per RN.

## 2017-09-21 NOTE — ED Triage Notes (Signed)
[facility] [employer] [facility]'s [facility] for dizziness that started yesterday. Per EMS, pt experienced a fall yesterday. (-) orthostatics. (+)nausea. CBG with EMS 76 mg/dl.

## 2017-09-21 NOTE — ED Notes (Signed)
ED Provider at bedside. 

## 2017-09-21 NOTE — ED Provider Notes (Signed)
[facility]-EMERGENCY DEPT Provider Note   CSN: [id number] Arrival date & time: 09/21/17  1007     History   Chief Complaint Chief Complaint  Patient presents with  . Dizziness    HPI Tonya Young is a 51 y.o. female.  HPI Patient with episodic dizziness which she describes as room spinning sensation worsened yesterday.  Has associated nausea.  She also complains of generalized weakness, abdominal pain and cough.  No fever or chills.  History of previous gastric bypass in 1999. Past Medical History:  Diagnosis Date  . Anxiety and depression   . Blindness   . GERD (gastroesophageal reflux disease)   . Hand injury    Pt stated right hand was broken sometime this year. Pt is having difficulty answering at the moment  . Hernia, incisional   . S/P gastric bypass     Patient Active Problem List   Diagnosis Date Noted  . Symptomatic anemia 05/11/2016  . Chest pain 05/11/2016    Past Surgical History:  Procedure Laterality Date  . CESAREAN SECTION    . CHOLECYSTECTOMY    . GASTRIC BYPASS    . HERNIA REPAIR      OB History    No data available       Home Medications    Prior to Admission medications   Medication Sig Start Date End Date Taking? Authorizing Provider  ALPRAZolam Prudy Feeler(XANAX) 1 MG tablet Take 1 tablet by mouth 3 (three) times daily. 04/22/16  Yes [provider]  carisoprodol (SOMA) 350 MG tablet Take 1 tablet by mouth daily as needed for muscle spasms.  05/08/16  Yes [provider]  escitalopram (LEXAPRO) 10 MG tablet Take 1 tablet by mouth daily. 02/04/16  Yes [provider]  famotidine (PEPCID) 40 MG tablet TAKE ONE TABLET BY MOUTH ONCE DAILY 12/18/16  Yes [provider]  gabapentin (NEURONTIN) 300 MG capsule Take 1 capsule by mouth 3 (three) times daily. 04/22/16  Yes [provider]  ibuprofen (ADVIL,MOTRIN) 800 MG tablet Take 800 mg by mouth every 8 (eight) hours as needed for moderate  pain.  12/27/16  Yes [provider]  latanoprost (XALATAN) 0.005 % ophthalmic solution Place 1 drop into both eyes at bedtime. 09/10/17  Yes [provider]  lidocaine (XYLOCAINE) 5 % ointment Apply 1 application topically daily as needed for mild pain or moderate pain.  09/05/15  Yes [provider]  metoCLOPramide (REGLAN) 5 MG/5ML solution Take 5 mLs by mouth 3 (three) times daily as needed for nausea. 04/19/16  Yes [provider]  omeprazole (PRILOSEC) 40 MG capsule TAKE ONE CAPSULE BY MOUTH ONCE DAILY 11/28/16  Yes [provider]  VENTOLIN HFA 108 (90 Base) MCG/ACT inhaler Inhale 2 puffs into the lungs every 6 (six) hours as needed for wheezing or shortness of breath.  05/04/16  Yes [provider]  zolpidem (AMBIEN) 5 MG tablet Take 5 mg by mouth at bedtime as needed for sleep. 01/16/17  Yes [provider]  fluticasone (FLONASE) 50 MCG/ACT nasal spray Place 2 sprays into both nostrils daily. 09/21/17   Loren RacerYelverton, Chevez Sambrano, MD  levofloxacin (LEVAQUIN) 500 MG tablet Take 1 tablet (500 mg total) by mouth daily. 09/22/17   Loren RacerYelverton, Melannie Metzner, MD  loratadine (CLARITIN) 10 MG tablet Take 1 tablet (10 mg total) by mouth daily. 09/21/17   Loren RacerYelverton, Jashawna Reever, MD  meclizine (ANTIVERT) 25 MG tablet Take 1 tablet (25 mg total) by mouth 3 (three) times daily as needed  for dizziness. 09/21/17   Loren Racer, MD    Family History No family history on file.  Social History Social History   Tobacco Use  . Smoking status: Never Smoker  . Smokeless tobacco: Never Used  Substance Use Topics  . Alcohol use: Yes    Comment: occasional  . Drug use: No     Allergies   Clove oil; Codeine; Ketorolac; Trazodone; Quetiapine fumarate; Tramadol; Hydrocodone-acetaminophen; and Latex   Review of Systems Review of Systems  Constitutional: Positive for fatigue. Negative for chills and fever.  Respiratory: Positive for cough. Negative for shortness of  breath.   Cardiovascular: Negative for chest pain, palpitations and leg swelling.  Gastrointestinal: Positive for abdominal pain and nausea.  Genitourinary: Negative for dysuria.  Musculoskeletal: Negative for back pain, myalgias and neck pain.  Skin: Negative for rash and wound.  Neurological: Positive for dizziness. Negative for weakness, light-headedness, numbness and headaches.  All other systems reviewed and are negative.    Physical Exam Updated Vital Signs BP 95/66 (BP Location: Left Arm)   Pulse 67   Temp 97.7 F (36.5 C) (Oral)   Resp 17   Ht 5\' 1"  (1.549 m)   Wt 69.9 kg (154 lb)   SpO2 96%   BMI 29.10 kg/m   Physical Exam  Constitutional: She is oriented to person, place, and time. She appears well-developed and well-nourished.  HENT:  Head: Normocephalic and atraumatic.  Mouth/Throat: Oropharynx is clear and moist.  Poor dentition.  Eyes: EOM are normal.  Patient is blind.  Neck: Normal range of motion. Neck supple.  Cardiovascular: Normal rate and regular rhythm. Exam reveals no gallop and no friction rub.  No murmur heard. Pulmonary/Chest: Effort normal and breath sounds normal.  Abdominal: Soft. Bowel sounds are normal. There is tenderness. There is no rebound and no guarding.  Epigastric and left upper quadrant tenderness to palpation.  No rebound or guarding.  Musculoskeletal: Normal range of motion. She exhibits no edema or tenderness.  Neurological: She is alert and oriented to person, place, and time.  Moving all extremities without focal deficit.  Sensation intact.  Unable to perform finger to nose testing due to blindness.  Skin: Skin is warm and dry. No rash noted. No erythema. There is pallor.  Psychiatric: She has a normal mood and affect. Her behavior is normal.  Nursing note and vitals reviewed.    ED Treatments / Results  Labs (all labs ordered are listed, but only abnormal results are displayed) Labs Reviewed  URINALYSIS, ROUTINE W REFLEX  MICROSCOPIC - Abnormal; Notable for the following components:      Result Value   Glucose, UA 50 (*)    Nitrite POSITIVE (*)    Leukocytes, UA SMALL (*)    Bacteria, UA FEW (*)    Squamous Epithelial / LPF 0-5 (*)    All other components within normal limits  COMPREHENSIVE METABOLIC PANEL - Abnormal; Notable for the following components:   Potassium 3.3 (*)    Chloride 118 (*)    CO2 21 (*)    Glucose, Bld 56 (*)    Calcium 6.5 (*)    Total Protein 5.9 (*)    Albumin 2.7 (*)    ALT 10 (*)    All other components within normal limits  TSH - Abnormal; Notable for the following components:   TSH 0.140 (*)    All other components within normal limits  CBG MONITORING, ED - Abnormal; Notable for the following components:  Glucose-Capillary 61 (*)    All other components within normal limits  CBC  LIPASE, BLOOD  T4, FREE  I-STAT TROPONIN, ED  CBG MONITORING, ED    EKG  EKG Interpretation  Date/Time:  Saturday September 21 2017 10:25:38 EST Ventricular Rate:  67 PR Interval:    QRS Duration: 89 QT Interval:  397 QTC Calculation: 420 R Axis:   8 Text Interpretation:  Sinus rhythm Confirmed by Loren RacerYelverton, Jaedyn Marrufo ([id number]) on 09/22/2017 8:18:01 AM       Radiology Dg Chest 2 View  Result Date: 09/21/2017 CLINICAL DATA:  Cough EXAM: CHEST  2 VIEW COMPARISON:  12/31/2016 FINDINGS: Cardiac shadow is stable. The overall inspiratory effort is poor. Chronic interstitial changes are again seen although some diffuse increased density is noted particularly in the right upper lobe likely representing some superimposed infiltrate. IMPRESSION: Chronic interstitial changes with superimposed right upper lobe infiltrate. Electronically Signed   By: Alcide CleverMark  Lukens M.D.   On: 09/21/2017 12:36   Ct Head Wo Contrast  Result Date: 09/21/2017 CLINICAL DATA:  51 year old with dizziness and nausea. EXAM: CT HEAD WITHOUT CONTRAST TECHNIQUE: Contiguous axial images were obtained from the base of the  skull through the vertex without intravenous contrast. COMPARISON:  02/05/2017 FINDINGS: Brain: No evidence of acute infarction, hemorrhage, hydrocephalus, extra-axial collection or mass lesion/mass effect. Vascular: No hyperdense vessel or unexpected calcification. Skull: Normal. Negative for fracture or focal lesion. Sinuses/Orbits: There is mucosal thickening and fluid level involving the left maxillary sinus. Mild mucosal thickening involving the sphenoid sinus and frontal sinus noted. Mastoid air cells are clear. Other: None. IMPRESSION: 1. No acute intracranial abnormalities. 2. Sinus inflammation. Electronically Signed   By: Signa Kellaylor  Stroud M.D.   On: 09/21/2017 12:48    Procedures Procedures (including critical care time)  Medications Ordered in ED Medications  sodium chloride 0.9 % bolus 1,000 mL (0 mLs Intravenous Stopped 09/21/17 1520)  dextrose 50 % solution 50 mL (50 mLs Intravenous Given 09/21/17 1134)  meclizine (ANTIVERT) tablet 25 mg (25 mg Oral Given 09/21/17 1350)  levofloxacin (LEVAQUIN) IVPB 750 mg (0 mg Intravenous Stopped 09/21/17 1520)     Initial Impression / Assessment and Plan / ED Course  I have reviewed the triage vital signs and the nursing notes.  Pertinent labs & imaging results that were available during my care of the patient were reviewed by me and considered in my medical decision making (see chart for details).    Treated for sinusitis and possible pneumonia.  Will start on antihistamine and steroid nasal spray.  [facility]  Appears that she is at her baseline.  She has chronic malnutrition and takes multiple sedatives.  Advised close follow-up with her primary physician.  Return precautions given.   Final Clinical Impressions(s) / ED Diagnoses   Final diagnoses:  Peripheral vertigo, unspecified laterality  Acute maxillary sinusitis, recurrence not specified  Pulmonary infiltrate in left lung on CXR    ED Discharge  Orders        Ordered    levofloxacin (LEVAQUIN) 500 MG tablet  Daily     09/21/17 1518    meclizine (ANTIVERT) 25 MG tablet  3 times daily PRN     09/21/17 1518    loratadine (CLARITIN) 10 MG tablet  Daily     09/21/17 1518    fluticasone (FLONASE) 50 MCG/ACT nasal spray  Daily     09/21/17 1518       Loren RacerYelverton, Jovon Streetman, MD 09/22/17 [id number]

## 2017-09-21 NOTE — ED Notes (Signed)
Patient transported to X-ray 

## 2017-09-21 NOTE — ED Notes (Signed)
IV ultrasound requested  

## 2017-09-21 NOTE — ED Notes (Signed)
Bed: WA09 Expected date:  Expected time:  Means of arrival:  Comments: 51 yo dizziness w/ nausea

## 2018-01-29 IMAGING — CR DG CHEST 2V
2 series · 2 of 2 positions shown · non-contrast
Comparison: 12/31/2016

CLINICAL DATA: Cough

EXAM:
CHEST  2 VIEW

[w chest lat]
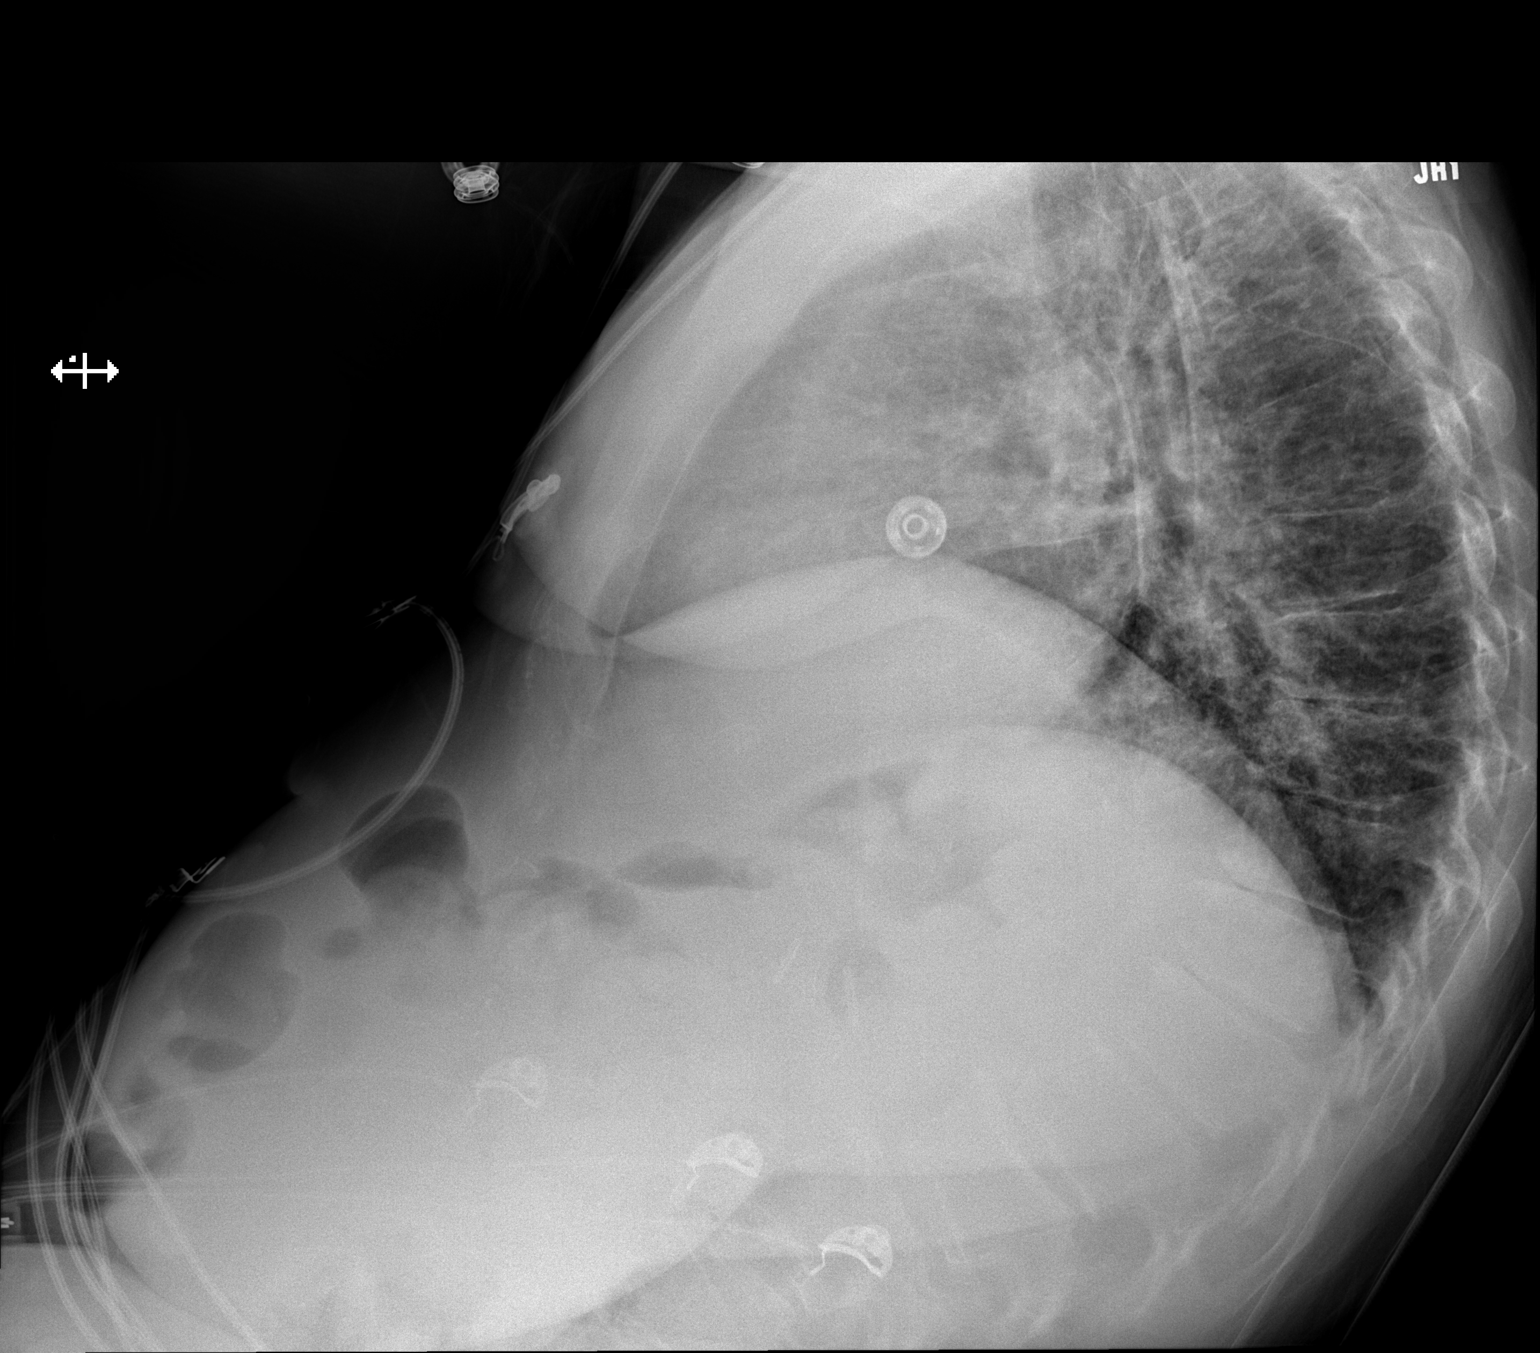

[x chest ap]
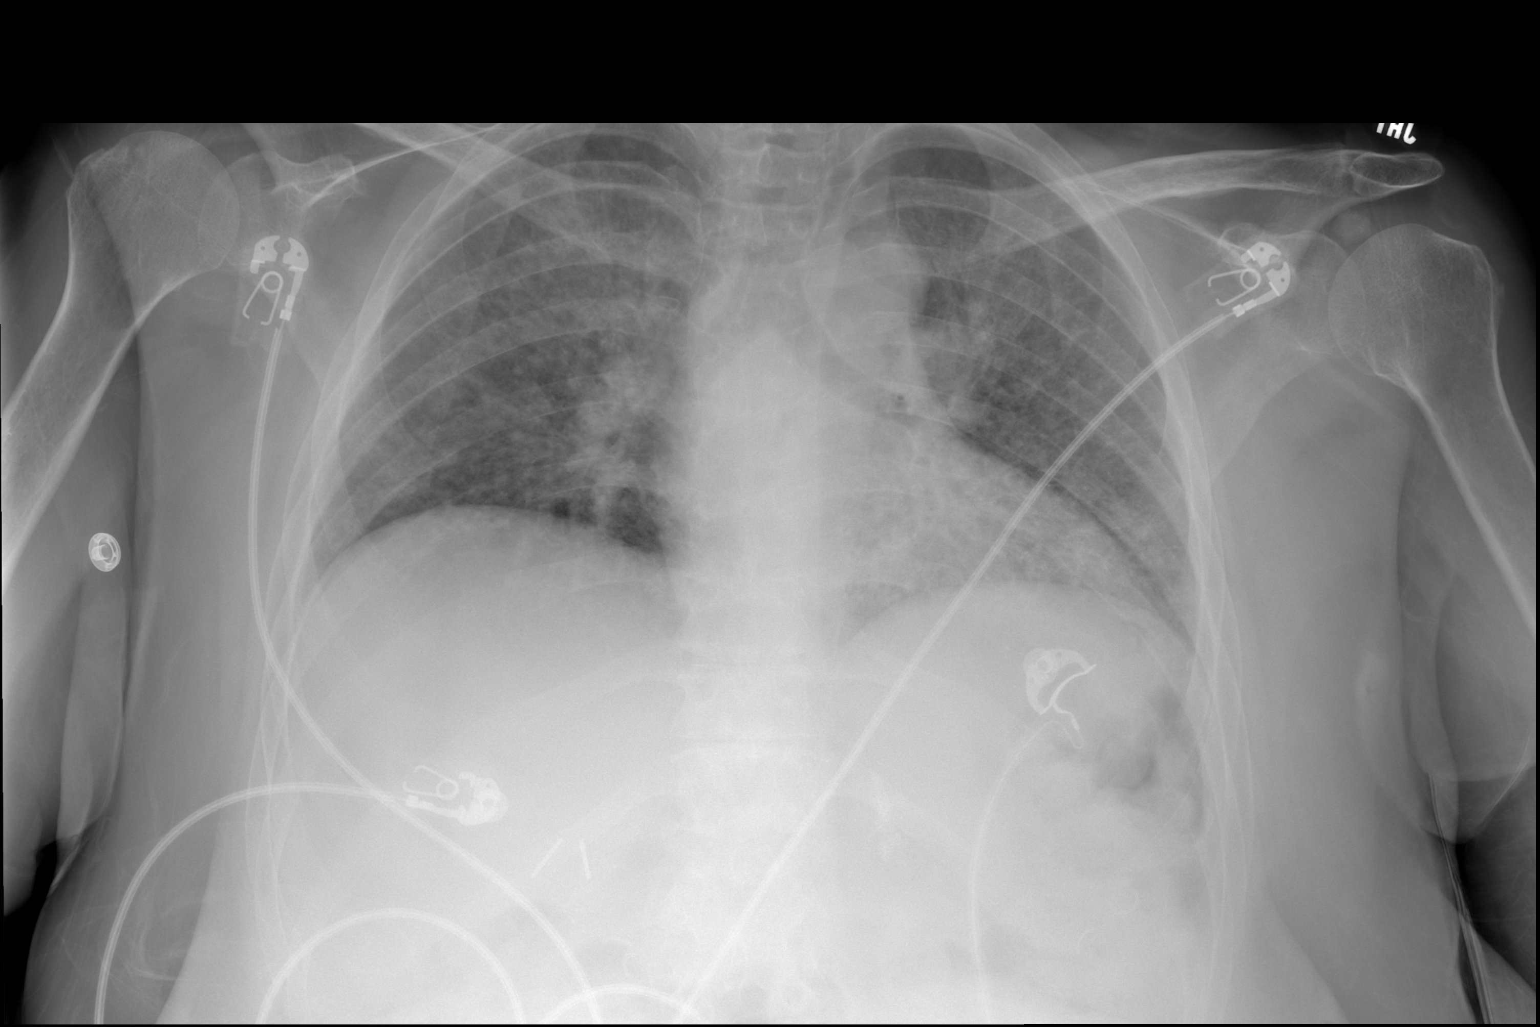

[2 of 2 positions shown; findings below may reference images not displayed]

FINDINGS: Cardiac shadow is stable. The overall inspiratory effort is poor.
Chronic interstitial changes are again seen although some diffuse
increased density is noted particularly in the right upper lobe
likely representing some superimposed infiltrate.
IMPRESSION: Chronic interstitial changes with superimposed right upper lobe
infiltrate.
